# Patient Record
Sex: Female | Born: 1955 | Race: Black or African American | Hispanic: No | Marital: Single | Smoking: Never smoker
Health system: Southern US, Community
[De-identification: ages and names within clinical notes are randomized; demographics above are authoritative.]

## PROBLEM LIST (undated history)

## (undated) DIAGNOSIS — G4730 Sleep apnea, unspecified: Secondary | ICD-10-CM

## (undated) DIAGNOSIS — M7060 Trochanteric bursitis, unspecified hip: Secondary | ICD-10-CM

## (undated) DIAGNOSIS — E785 Hyperlipidemia, unspecified: Secondary | ICD-10-CM

## (undated) DIAGNOSIS — F32A Depression, unspecified: Secondary | ICD-10-CM

## (undated) DIAGNOSIS — I10 Essential (primary) hypertension: Secondary | ICD-10-CM

## (undated) DIAGNOSIS — E119 Type 2 diabetes mellitus without complications: Secondary | ICD-10-CM

## (undated) DIAGNOSIS — K219 Gastro-esophageal reflux disease without esophagitis: Secondary | ICD-10-CM

## (undated) DIAGNOSIS — G4700 Insomnia, unspecified: Secondary | ICD-10-CM

## (undated) DIAGNOSIS — G8929 Other chronic pain: Secondary | ICD-10-CM

## (undated) DIAGNOSIS — E669 Obesity, unspecified: Secondary | ICD-10-CM

## (undated) DIAGNOSIS — M549 Dorsalgia, unspecified: Secondary | ICD-10-CM

## (undated) DIAGNOSIS — F419 Anxiety disorder, unspecified: Secondary | ICD-10-CM

## (undated) DIAGNOSIS — F329 Major depressive disorder, single episode, unspecified: Secondary | ICD-10-CM

## (undated) HISTORY — PX: OTHER SURGICAL HISTORY: SHX169

## (undated) HISTORY — PX: HEMORROIDECTOMY: SUR656

## (undated) HISTORY — PX: BACK SURGERY: SHX140

---

## 1997-06-18 HISTORY — PX: LUMBAR DISC SURGERY: SHX700

## 2012-08-01 ENCOUNTER — Ambulatory Visit: Payer: Self-pay | Admitting: Family Medicine

## 2012-09-18 ENCOUNTER — Ambulatory Visit: Payer: Self-pay

## 2013-01-16 ENCOUNTER — Ambulatory Visit: Payer: Self-pay

## 2014-01-09 ENCOUNTER — Emergency Department: Payer: Self-pay | Admitting: Internal Medicine

## 2014-01-09 LAB — CBC WITH DIFFERENTIAL/PLATELET
BASOS ABS: 0.1 10*3/uL (ref 0.0–0.1)
BASOS PCT: 0.5 %
EOS ABS: 0.1 10*3/uL (ref 0.0–0.7)
Eosinophil %: 0.9 %
HCT: 33.5 % — ABNORMAL LOW (ref 35.0–47.0)
HGB: 10.7 g/dL — ABNORMAL LOW (ref 12.0–16.0)
LYMPHS ABS: 4.4 10*3/uL — AB (ref 1.0–3.6)
Lymphocyte %: 33.8 %
MCH: 29.6 pg (ref 26.0–34.0)
MCHC: 32.1 g/dL (ref 32.0–36.0)
MCV: 92 fL (ref 80–100)
Monocyte #: 1 x10 3/mm — ABNORMAL HIGH (ref 0.2–0.9)
Monocyte %: 7.4 %
NEUTROS ABS: 7.5 10*3/uL — AB (ref 1.4–6.5)
Neutrophil %: 57.4 %
Platelet: 282 10*3/uL (ref 150–440)
RBC: 3.63 10*6/uL — ABNORMAL LOW (ref 3.80–5.20)
RDW: 16.3 % — AB (ref 11.5–14.5)
WBC: 13 10*3/uL — AB (ref 3.6–11.0)

## 2014-01-09 LAB — BASIC METABOLIC PANEL
Anion Gap: 7 (ref 7–16)
BUN: 15 mg/dL (ref 7–18)
CREATININE: 0.93 mg/dL (ref 0.60–1.30)
Calcium, Total: 8.8 mg/dL (ref 8.5–10.1)
Chloride: 103 mmol/L (ref 98–107)
Co2: 30 mmol/L (ref 21–32)
EGFR (African American): >60
GLUCOSE: 158 mg/dL — AB (ref 65–99)
OSMOLALITY: 284 (ref 275–301)
Potassium: 3.6 mmol/L (ref 3.5–5.1)
Sodium: 140 mmol/L (ref 136–145)

## 2014-01-09 LAB — TROPONIN I: Troponin-I: <0.02 ng/mL

## 2014-01-09 LAB — CK: CK, Total: 790 U/L — ABNORMAL HIGH

## 2014-01-09 LAB — CK-MB: CK-MB: 2.4 ng/mL (ref 0.5–3.6)

## 2014-01-11 ENCOUNTER — Ambulatory Visit: Payer: Self-pay

## 2016-01-15 ENCOUNTER — Encounter: Payer: Self-pay | Admitting: Emergency Medicine

## 2016-01-15 ENCOUNTER — Emergency Department
Admission: EM | Admit: 2016-01-15 | Discharge: 2016-01-15 | Disposition: A | Discharge status: Home / Self Care | Payer: Medicare Other | Attending: Emergency Medicine | Admitting: Emergency Medicine

## 2016-01-15 DIAGNOSIS — R519 Headache, unspecified: Secondary | ICD-10-CM | POA: Diagnosis present

## 2016-01-15 DIAGNOSIS — G971 Other reaction to spinal and lumbar puncture: Secondary | ICD-10-CM | POA: Diagnosis not present

## 2016-01-15 DIAGNOSIS — R51 Headache: Secondary | ICD-10-CM

## 2016-01-15 DIAGNOSIS — E119 Type 2 diabetes mellitus without complications: Secondary | ICD-10-CM | POA: Diagnosis not present

## 2016-01-15 DIAGNOSIS — I10 Essential (primary) hypertension: Secondary | ICD-10-CM | POA: Insufficient documentation

## 2016-01-15 HISTORY — DX: Other chronic pain: G89.29

## 2016-01-15 HISTORY — DX: Dorsalgia, unspecified: M54.9

## 2016-01-15 HISTORY — DX: Type 2 diabetes mellitus without complications: E11.9

## 2016-01-15 HISTORY — DX: Essential (primary) hypertension: I10

## 2016-01-15 LAB — CBC WITH DIFFERENTIAL/PLATELET
BASOS ABS: 0.2 10*3/uL — AB (ref 0–0.1)
BASOS PCT: 2 %
Eosinophils Absolute: 0.1 10*3/uL (ref 0–0.7)
Eosinophils Relative: 1 %
HEMATOCRIT: 37.3 % (ref 35.0–47.0)
HEMOGLOBIN: 12.5 g/dL (ref 12.0–16.0)
Lymphocytes Relative: 32 %
Lymphs Abs: 3.5 10*3/uL (ref 1.0–3.6)
MCH: 29.6 pg (ref 26.0–34.0)
MCHC: 33.6 g/dL (ref 32.0–36.0)
MCV: 88.2 fL (ref 80.0–100.0)
Monocytes Absolute: 0.8 10*3/uL (ref 0.2–0.9)
Monocytes Relative: 7 %
NEUTROS ABS: 6.6 10*3/uL — AB (ref 1.4–6.5)
NEUTROS PCT: 58 %
Platelets: 321 10*3/uL (ref 150–440)
RBC: 4.23 MIL/uL (ref 3.80–5.20)
RDW: 15.7 % — ABNORMAL HIGH (ref 11.5–14.5)
WBC: 11.2 10*3/uL — ABNORMAL HIGH (ref 3.6–11.0)

## 2016-01-15 LAB — COMPREHENSIVE METABOLIC PANEL
ALK PHOS: 93 U/L (ref 38–126)
ALT: 22 U/L (ref 14–54)
ANION GAP: 9 (ref 5–15)
AST: 19 U/L (ref 15–41)
Albumin: 4.1 g/dL (ref 3.5–5.0)
BILIRUBIN TOTAL: 0.6 mg/dL (ref 0.3–1.2)
BUN: 19 mg/dL (ref 6–20)
CALCIUM: 9.5 mg/dL (ref 8.9–10.3)
CO2: 28 mmol/L (ref 22–32)
Chloride: 100 mmol/L — ABNORMAL LOW (ref 101–111)
Creatinine, Ser: 0.66 mg/dL (ref 0.44–1.00)
GFR calc non Af Amer: >60 mL/min (ref >60)
Glucose, Bld: 171 mg/dL — ABNORMAL HIGH (ref 65–99)
Potassium: 4 mmol/L (ref 3.5–5.1)
SODIUM: 137 mmol/L (ref 135–145)
TOTAL PROTEIN: 7.4 g/dL (ref 6.5–8.1)

## 2016-01-15 MED ORDER — SODIUM CHLORIDE 0.9 % IV BOLUS (SEPSIS)
1000.0000 mL | Freq: Once | INTRAVENOUS | Status: AC
  Administered 2016-01-15: 1000 mL via INTRAVENOUS

## 2016-01-15 MED ORDER — HYDROMORPHONE HCL 1 MG/ML IJ SOLN
1.0000 mg | Freq: Once | INTRAMUSCULAR | Status: AC
  Administered 2016-01-15: 1 mg via INTRAVENOUS
  Filled 2016-01-15: qty 1

## 2016-01-15 MED ORDER — ONDANSETRON HCL 4 MG/2ML IJ SOLN
4.0000 mg | Freq: Once | INTRAMUSCULAR | Status: AC
  Administered 2016-01-15: 4 mg via INTRAVENOUS
  Filled 2016-01-15: qty 2

## 2016-01-15 MED ORDER — LIDOCAINE 5 % EX PTCH
1.0000 | MEDICATED_PATCH | CUTANEOUS | Status: DC
  Administered 2016-01-15: 1 via TRANSDERMAL
  Filled 2016-01-15: qty 1

## 2016-01-15 MED ORDER — PROMETHAZINE HCL 12.5 MG RE SUPP: 12.5000 mg | Freq: Four times a day (QID) | RECTAL | 0 refills | Status: DC | PRN

## 2016-01-15 MED ORDER — CAFFEINE CITRATE NICU IV 10 MG/ML (BASE)
20.0000 mg/kg | Freq: Once | INTRAVENOUS | Status: DC
Start: 2016-01-15 — End: 2016-01-15

## 2016-01-15 MED ORDER — PROMETHAZINE HCL 25 MG/ML IJ SOLN
6.2500 mg | Freq: Once | INTRAMUSCULAR | Status: AC
  Administered 2016-01-15: 6.25 mg via INTRAVENOUS
  Filled 2016-01-15: qty 1

## 2016-01-15 MED ORDER — PROMETHAZINE HCL 12.5 MG PO TABS: 12.5000 mg | ORAL_TABLET | Freq: Four times a day (QID) | ORAL | 0 refills | Status: DC | PRN

## 2016-01-15 MED ORDER — HYDROMORPHONE HCL 2 MG PO TABS: 2.0000 mg | ORAL_TABLET | Freq: Two times a day (BID) | ORAL | 0 refills | Status: DC | PRN

## 2016-01-15 MED ORDER — SODIUM CHLORIDE 0.9 % IV SOLN
500.0000 mg | Freq: Once | INTRAVENOUS | Status: AC
  Administered 2016-01-15: 500 mg via INTRAVENOUS
  Filled 2016-01-15: qty 2

## 2016-01-15 MED ORDER — HYDROMORPHONE HCL 2 MG PO TABS
2.0000 mg | ORAL_TABLET | Freq: Once | ORAL | Status: AC
  Administered 2016-01-15: 2 mg via ORAL
  Filled 2016-01-15: qty 1

## 2016-01-15 MED ORDER — LORAZEPAM 2 MG/ML IJ SOLN
1.0000 mg | Freq: Once | INTRAMUSCULAR | Status: AC
  Administered 2016-01-15: 1 mg via INTRAVENOUS
  Filled 2016-01-15: qty 1

## 2016-01-15 MED ORDER — DIPHENHYDRAMINE HCL 50 MG/ML IJ SOLN
50.0000 mg | Freq: Once | INTRAMUSCULAR | Status: AC
  Administered 2016-01-15: 50 mg via INTRAVENOUS
  Filled 2016-01-15: qty 1

## 2016-01-15 NOTE — ED Notes (Signed)
Patient placed on 2L O2 via [place] for sats at 88-89% while resting.

## 2016-01-15 NOTE — ED Notes (Addendum)
Pt states the lidoderm patch is causing me to itch. States it has been a long time since I have had one but it is causing me to itch. Nurse informs pt she will add it to list of allergies. No hives noted at this time. No distress noted. Huel Cote, MD informed

## 2016-01-15 NOTE — Discharge Instructions (Signed)
Please return immediately if condition worsens. Please contact her primary physician or the physician you were given for referral. If you have any specialist physicians involved in her treatment and plan please also contact them. Thank you for using [facility]. ° °

## 2016-01-15 NOTE — ED Notes (Signed)
Two unsuccessful IV attempts made. Will defer to another RN.

## 2016-01-15 NOTE — ED Triage Notes (Addendum)
BIB EMS for c/o headache and nausea for one week. Pt diagnosed with RMSF on the 27th. Started on Doxycycline. Pt also had a Spinal Tap at [facility] on the 27th

## 2016-01-15 NOTE — ED Provider Notes (Signed)
Time Seen: Approximately 1606  I have reviewed the triage notes  Chief Complaint: Migraine and Nausea   History of Present Illness: Courtney [name] is a 60 y.o. female who apparently sat a one-week history of headache and had a tick bite on the posterior surface of her right knee. The patient states that she went to [facility] and apparently had a very extensive workup 2 days ago which include a head CT, spinal tap, laboratory work. Patient was placed on doxycycline. She states that she has not had a rash though she did have a fever. She denies any productive cough, wheezing, dysuria or hematuria. She states she hasn't had a fever recently but started to develop a very positional throbbing headache with some persistent nausea and vomiting. He has tried taking her medications at home without any significant relief. She states that she notified EMS and was trying to be transported to [facility] system, but was [place]   Past Medical History:  Diagnosis Date  . Chronic back pain   . Diabetes mellitus without complication (HCC)   . Hypertension     There are no active problems to display for this patient.   Past Surgical History:  Procedure Laterality Date  . BACK SURGERY    . right shoulder surgery       Past Surgical History:  Procedure Laterality Date  . BACK SURGERY    . right shoulder surgery         Allergies:  Review of patient's allergies indicates not on file.  Family History: No family history on file.  Social History: Social History  Substance Use Topics  . Smoking status: Never Smoker  . Smokeless tobacco: Never Used  . Alcohol use No     Review of Systems:   10 point review of systems was performed and was otherwise negative:  Constitutional: No Current fever Eyes: No visual disturbances ENT: No sore throat, ear pain Cardiac: No chest pain Respiratory: No shortness of breath, wheezing, or  stridor Abdomen: No abdominal pain, no vomiting, No diarrhea Endocrine: No weight loss, No night sweats Extremities: No peripheral edema, cyanosis Skin: No rashes, easy bruising Neurologic: No focal weakness, trouble with speech or swollowing Urologic: No dysuria, Hematuria, or urinary frequency Headache is global in nature and somewhat positional  Physical Exam:  ED Triage Vitals  Enc Vitals Group     BP 01/15/16 1601 126/65     Pulse Rate 01/15/16 1601 62     Resp 01/15/16 1601 18     Temp --      Temp Source 01/15/16 1601 Oral     SpO2 01/15/16 1601 94 %     Weight 01/15/16 1601 178 lb (80.7 kg)     Height 01/15/16 1601 5' 0.5" (1.537 m)     Head Circumference --      Peak Flow --      Pain Score 01/15/16 1556 10     Pain Loc --      Pain Edu? --      Excl. in GC? --     General: Awake , Alert , and Oriented times 3; GCS 15. His uncomfortable, anxious Head: Normal cephalic , atraumatic Eyes: Pupils equal , round, reactive to light Nose/Throat: No nasal drainage, patent upper airway without erythema or exudate.  Neck: Supple, Full range of motion, No anterior adenopathy or palpable thyroid masses Lungs: Clear to ascultation without wheezes , rhonchi, or  rales Heart: Regular rate, regular rhythm without murmurs , gallops , or rubs Abdomen: Soft, non tender without rebound, guarding , or rigidity; bowel sounds positive and symmetric in all 4 quadrants. No organomegaly .        Extremities: 2 plus symmetric pulses. No edema, clubbing or cyanosis Neurologic: normal ambulation, Motor symmetric without deficits, sensory intact Skin: warm, dry, no rashes examination and posterior area of her right leg does not show any evidence of erythema exudate or drainage. Labs:   All laboratory work was reviewed including any pertinent negatives or positives listed below:  Labs Reviewed  COMPREHENSIVE METABOLIC PANEL  CBC WITH DIFFERENTIAL/PLATELET  Laboratory work was reviewed and  showed no clinically significant abnormalities.    ED Course:  Patient's stay here was uneventful and had some gradual symptomatic improvement. She was given IV fluid bolus, IV caffeine, series of doses of IV Dilaudid and Zofran. Her symptoms and presentation point to a post spinal tap headache with a very positional throbbing global nature to her discomfort. I felt this was unlikely to be acute life-threatening cosmetically with her most recent negative evaluation at another facility with a negative head CT and spinal tap, etc. Her only receiving doxycycline for [facility]. spotted fever and she was advised continue the medication. She most likely needs a epidural blood patch and had discussed it briefly with the on-call anesthesiologist and the patient was referred to the pain clinic. She's also been advised if she is unsuccessful establish an appointment with our local pain clinic that she can always return to the [facility] system and they would most likely be able to do a blood patch if needed. She was advised to return here if she develops focal weakness, fever, or any other new concerns.  Clinical Course     Assessment: * Post lumbar puncture headache     Plan:  Outpatient Prescriptions for Dilaudid, Phenergan tablets, and Phenergan suppositories Patient was advised to return immediately if condition worsens. Patient was advised to follow up with their primary care physician or other specialized physicians involved in their outpatient care. The patient and/or family member/power of attorney had laboratory results reviewed at the bedside. All questions and concerns were addressed and appropriate discharge instructions were distributed by the nursing staff.            Jennye Moccasin, MD 01/15/16 [id number]

## 2016-04-05 ENCOUNTER — Encounter: Payer: Self-pay | Admitting: Emergency Medicine

## 2016-04-05 ENCOUNTER — Emergency Department: Payer: Medicare Other

## 2016-04-05 ENCOUNTER — Ambulatory Visit (INDEPENDENT_AMBULATORY_CARE_PROVIDER_SITE_OTHER)
Admission: EM | Admit: 2016-04-05 | Discharge: 2016-04-05 | Disposition: A | Discharge status: Home / Self Care | Payer: Medicare Other | Source: Home / Self Care | Attending: Family Medicine | Admitting: Family Medicine

## 2016-04-05 ENCOUNTER — Emergency Department
Admission: EM | Admit: 2016-04-05 | Discharge: 2016-04-05 | Disposition: A | Discharge status: Home / Self Care | Payer: Medicare Other | Attending: Emergency Medicine | Admitting: Emergency Medicine

## 2016-04-05 DIAGNOSIS — Y929 Unspecified place or not applicable: Secondary | ICD-10-CM | POA: Insufficient documentation

## 2016-04-05 DIAGNOSIS — E785 Hyperlipidemia, unspecified: Secondary | ICD-10-CM | POA: Insufficient documentation

## 2016-04-05 DIAGNOSIS — E119 Type 2 diabetes mellitus without complications: Secondary | ICD-10-CM

## 2016-04-05 DIAGNOSIS — S299XXA Unspecified injury of thorax, initial encounter: Secondary | ICD-10-CM | POA: Diagnosis present

## 2016-04-05 DIAGNOSIS — I10 Essential (primary) hypertension: Secondary | ICD-10-CM

## 2016-04-05 DIAGNOSIS — R079 Chest pain, unspecified: Secondary | ICD-10-CM | POA: Insufficient documentation

## 2016-04-05 DIAGNOSIS — R071 Chest pain on breathing: Secondary | ICD-10-CM | POA: Diagnosis not present

## 2016-04-05 DIAGNOSIS — Y999 Unspecified external cause status: Secondary | ICD-10-CM | POA: Insufficient documentation

## 2016-04-05 DIAGNOSIS — Z79899 Other long term (current) drug therapy: Secondary | ICD-10-CM | POA: Insufficient documentation

## 2016-04-05 DIAGNOSIS — S2232XA Fracture of one rib, left side, initial encounter for closed fracture: Secondary | ICD-10-CM | POA: Insufficient documentation

## 2016-04-05 DIAGNOSIS — R0789 Other chest pain: Secondary | ICD-10-CM | POA: Diagnosis not present

## 2016-04-05 DIAGNOSIS — W19XXXA Unspecified fall, initial encounter: Secondary | ICD-10-CM | POA: Insufficient documentation

## 2016-04-05 DIAGNOSIS — Y939 Activity, unspecified: Secondary | ICD-10-CM | POA: Insufficient documentation

## 2016-04-05 LAB — COMPREHENSIVE METABOLIC PANEL
ALBUMIN: 4 g/dL (ref 3.5–5.0)
ALK PHOS: 104 U/L (ref 38–126)
ALT: 34 U/L (ref 14–54)
ANION GAP: 10 (ref 5–15)
AST: 43 U/L — ABNORMAL HIGH (ref 15–41)
BILIRUBIN TOTAL: 0.9 mg/dL (ref 0.3–1.2)
BUN: 15 mg/dL (ref 6–20)
CALCIUM: 9.1 mg/dL (ref 8.9–10.3)
CO2: 26 mmol/L (ref 22–32)
Chloride: 100 mmol/L — ABNORMAL LOW (ref 101–111)
Creatinine, Ser: 0.78 mg/dL (ref 0.44–1.00)
Glucose, Bld: 253 mg/dL — ABNORMAL HIGH (ref 65–99)
POTASSIUM: 4 mmol/L (ref 3.5–5.1)
Sodium: 136 mmol/L (ref 135–145)
TOTAL PROTEIN: 7.3 g/dL (ref 6.5–8.1)

## 2016-04-05 LAB — CBC
HEMATOCRIT: 35.7 % (ref 35.0–47.0)
Hemoglobin: 12 g/dL (ref 12.0–16.0)
MCH: 29.7 pg (ref 26.0–34.0)
MCHC: 33.8 g/dL (ref 32.0–36.0)
MCV: 87.9 fL (ref 80.0–100.0)
PLATELETS: 374 10*3/uL (ref 150–440)
RBC: 4.06 MIL/uL (ref 3.80–5.20)
RDW: 16 % — AB (ref 11.5–14.5)
WBC: 10.9 10*3/uL (ref 3.6–11.0)

## 2016-04-05 LAB — TROPONIN I: Troponin I: <0.03 ng/mL (ref <0.03)

## 2016-04-05 LAB — FIBRIN DERIVATIVES D-DIMER (ARMC ONLY): FIBRIN DERIVATIVES D-DIMER (ARMC): 365 (ref 0–499)

## 2016-04-05 MED ORDER — HYDROMORPHONE HCL 1 MG/ML IJ SOLN
1.0000 mg | Freq: Once | INTRAMUSCULAR | Status: AC
  Administered 2016-04-05: 1 mg via INTRAVENOUS
  Filled 2016-04-05: qty 1

## 2016-04-05 MED ORDER — MORPHINE SULFATE (PF) 2 MG/ML IV SOLN
4.0000 mg | Freq: Once | INTRAVENOUS | Status: AC
  Administered 2016-04-05: 4 mg via INTRAVENOUS
  Filled 2016-04-05: qty 2

## 2016-04-05 MED ORDER — LORAZEPAM 2 MG/ML IJ SOLN
1.0000 mg | Freq: Once | INTRAMUSCULAR | Status: AC
  Administered 2016-04-05: 1 mg via INTRAVENOUS
  Filled 2016-04-05: qty 1

## 2016-04-05 MED ORDER — LORAZEPAM 2 MG/ML IJ SOLN
0.5000 mg | Freq: Once | INTRAMUSCULAR | Status: AC
  Administered 2016-04-05: 0.5 mg via INTRAVENOUS
  Filled 2016-04-05: qty 1

## 2016-04-05 MED ORDER — ASPIRIN 81 MG PO CHEW
324.0000 mg | CHEWABLE_TABLET | Freq: Once | ORAL | Status: AC
  Administered 2016-04-05: 324 mg via ORAL

## 2016-04-05 MED ORDER — IOPAMIDOL (ISOVUE-370) INJECTION 76%
75.0000 mL | Freq: Once | INTRAVENOUS | Status: AC | PRN
  Administered 2016-04-05: 75 mL via INTRAVENOUS

## 2016-04-05 MED ORDER — OXYCODONE-ACETAMINOPHEN 7.5-325 MG PO TABS: 1.0000 | ORAL_TABLET | ORAL | 0 refills | Status: AC | PRN
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
## 2016-04-05 NOTE — ED Notes (Addendum)
Pt bib EMS from urgent care w/ c/o CP.  Pt sts that CP has been ongoing x 1 week.  Pt c/o incr SOB today. Pt sts that pain is in L chest and radiates to back.  Pt A/Ox4, able to answer all questions w/o issues.  Pt appears upset in triage.  Per EMS pt received 81 mg ASA and 1 SL NTG.  EMS sts pt received some relief w/ NTG

## 2016-04-05 NOTE — ED Notes (Signed)
EMS called to transport patient to [facility] ED 
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
## 2016-04-05 NOTE — ED Notes (Signed)
Dr. Mayford KnifeWilliams aware of pain control issues with pt. No new orders at this time.
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
## 2016-04-05 NOTE — ED Notes (Signed)
Discharge instructions reviewed with patient. Questions fielded by this RN. Patient verbalizes understanding of instructions. Patient discharged home in stable condition per Williams MD . No acute distress noted at time of discharge.   

## 2016-04-08 ENCOUNTER — Telehealth: Payer: Self-pay

## 2016-04-08 NOTE — Telephone Encounter (Signed)
Courtesy call back completed today for patients recent visit at [facility]. Patient did not answer, left message on voicemail to call back with any questions or concerns.   

## 2016-05-01 ENCOUNTER — Emergency Department: Payer: Medicare Other

## 2016-05-01 ENCOUNTER — Emergency Department
Admission: EM | Admit: 2016-05-01 | Discharge: 2016-05-01 | Disposition: A | Discharge status: Home / Self Care | Payer: Medicare Other | Attending: Emergency Medicine | Admitting: Emergency Medicine

## 2016-05-01 DIAGNOSIS — X58XXXD Exposure to other specified factors, subsequent encounter: Secondary | ICD-10-CM | POA: Insufficient documentation

## 2016-05-01 DIAGNOSIS — E119 Type 2 diabetes mellitus without complications: Secondary | ICD-10-CM | POA: Diagnosis not present

## 2016-05-01 DIAGNOSIS — J0100 Acute maxillary sinusitis, unspecified: Secondary | ICD-10-CM

## 2016-05-01 DIAGNOSIS — S2242XD Multiple fractures of ribs, left side, subsequent encounter for fracture with routine healing: Secondary | ICD-10-CM | POA: Diagnosis not present

## 2016-05-01 DIAGNOSIS — I10 Essential (primary) hypertension: Secondary | ICD-10-CM | POA: Diagnosis not present

## 2016-05-01 DIAGNOSIS — R51 Headache: Secondary | ICD-10-CM | POA: Diagnosis present

## 2016-05-01 LAB — URINALYSIS COMPLETE WITH MICROSCOPIC (ARMC ONLY)
Bilirubin Urine: NEGATIVE
Glucose, UA: 50 mg/dL — AB
HGB URINE DIPSTICK: NEGATIVE
KETONES UR: NEGATIVE mg/dL
Leukocytes, UA: NEGATIVE
NITRITE: NEGATIVE
PROTEIN: NEGATIVE mg/dL
SPECIFIC GRAVITY, URINE: 1.018 (ref 1.005–1.030)
pH: 7 (ref 5.0–8.0)

## 2016-05-01 LAB — COMPREHENSIVE METABOLIC PANEL
ALK PHOS: 81 U/L (ref 38–126)
ALT: 24 U/L (ref 14–54)
ANION GAP: 9 (ref 5–15)
AST: 36 U/L (ref 15–41)
Albumin: 4 g/dL (ref 3.5–5.0)
BUN: 14 mg/dL (ref 6–20)
CALCIUM: 8.7 mg/dL — AB (ref 8.9–10.3)
CHLORIDE: 102 mmol/L (ref 101–111)
CO2: 27 mmol/L (ref 22–32)
CREATININE: 0.85 mg/dL (ref 0.44–1.00)
Glucose, Bld: 147 mg/dL — ABNORMAL HIGH (ref 65–99)
Potassium: 4.4 mmol/L (ref 3.5–5.1)
SODIUM: 138 mmol/L (ref 135–145)
Total Bilirubin: 0.5 mg/dL (ref 0.3–1.2)
Total Protein: 7.2 g/dL (ref 6.5–8.1)

## 2016-05-01 LAB — CBC WITH DIFFERENTIAL/PLATELET
Basophils Absolute: 0.1 10*3/uL (ref 0–0.1)
Basophils Relative: 0 %
EOS PCT: 1 %
Eosinophils Absolute: 0.2 10*3/uL (ref 0–0.7)
HEMATOCRIT: 39.2 % (ref 35.0–47.0)
Hemoglobin: 12.8 g/dL (ref 12.0–16.0)
LYMPHS ABS: 2.9 10*3/uL (ref 1.0–3.6)
LYMPHS PCT: 20 %
MCH: 29 pg (ref 26.0–34.0)
MCHC: 32.8 g/dL (ref 32.0–36.0)
MCV: 88.5 fL (ref 80.0–100.0)
MONO ABS: 1.3 10*3/uL — AB (ref 0.2–0.9)
Monocytes Relative: 9 %
Neutro Abs: 10.2 10*3/uL — ABNORMAL HIGH (ref 1.4–6.5)
Neutrophils Relative %: 70 %
PLATELETS: 346 10*3/uL (ref 150–440)
RBC: 4.43 MIL/uL (ref 3.80–5.20)
RDW: 16.5 % — AB (ref 11.5–14.5)
WBC: 14.6 10*3/uL — ABNORMAL HIGH (ref 3.6–11.0)

## 2016-05-01 MED ORDER — FENTANYL CITRATE (PF) 100 MCG/2ML IJ SOLN
25.0000 ug | Freq: Once | INTRAMUSCULAR | Status: AC
  Administered 2016-05-01: 25 ug via INTRAVENOUS
  Filled 2016-05-01: qty 2

## 2016-05-01 MED ORDER — METOCLOPRAMIDE HCL 10 MG PO TABS: 10.0000 mg | ORAL_TABLET | Freq: Four times a day (QID) | ORAL | 0 refills | Status: DC | PRN

## 2016-05-01 MED ORDER — ACETAMINOPHEN 500 MG PO TABS
1000.0000 mg | ORAL_TABLET | Freq: Once | ORAL | Status: AC
  Administered 2016-05-01: 1000 mg via ORAL
  Filled 2016-05-01: qty 2

## 2016-05-01 MED ORDER — MORPHINE SULFATE (PF) 4 MG/ML IV SOLN
4.0000 mg | Freq: Once | INTRAVENOUS | Status: AC
  Administered 2016-05-01: 4 mg via INTRAVENOUS
  Filled 2016-05-01: qty 1

## 2016-05-01 MED ORDER — DOXYCYCLINE HYCLATE 100 MG PO CAPS: 100.0000 mg | ORAL_CAPSULE | Freq: Two times a day (BID) | ORAL | 0 refills | Status: DC

## 2016-05-01 MED ORDER — HYDROCODONE-ACETAMINOPHEN 5-325 MG PO TABS: 1.0000 | ORAL_TABLET | Freq: Four times a day (QID) | ORAL | 0 refills | Status: DC | PRN

## 2016-05-01 MED ORDER — SODIUM CHLORIDE 0.9 % IV BOLUS (SEPSIS)
1000.0000 mL | Freq: Once | INTRAVENOUS | Status: AC
  Administered 2016-05-01: 1000 mL via INTRAVENOUS

## 2016-05-01 MED ORDER — DOXYCYCLINE HYCLATE 100 MG PO TABS
100.0000 mg | ORAL_TABLET | Freq: Once | ORAL | Status: AC
  Administered 2016-05-01: 100 mg via ORAL
  Filled 2016-05-01: qty 1

## 2016-05-01 MED ORDER — PROMETHAZINE HCL 25 MG/ML IJ SOLN
25.0000 mg | Freq: Once | INTRAMUSCULAR | Status: AC
  Administered 2016-05-01: 25 mg via INTRAVENOUS
  Filled 2016-05-01: qty 1

## 2016-05-01 NOTE — ED Triage Notes (Signed)
Pt c/o sinus congestion with HA and nausea and vomiting for the past 5 days, states she has been taking OTC sinus meds without relief..Marland Kitchen

## 2016-05-01 NOTE — ED Provider Notes (Signed)
[facility]-EMERGENCY DEPARTMENT Provider Note   CSN: [id number] Arrival date & time: 05/01/16  1457     History   Chief Complaint Chief Complaint  Patient presents with  . Emesis  . Headache  . Facial Pain    HPI Courtney [name] is a 60 y.o. female hx of DM, HTN, back pain, Here presenting with headaches, sinus congestion, cough, chills. States that she has been having constant headaches for the last 5 days. Been trying to take some sinus medicine as well as Tylenol with no relief. Has some vomiting associated with it as well. She does have some subjective chills as well as productive cough. Recently seen in the ED and had some rib fractures with no complicating features.   The history is provided by the patient.    Past Medical History:  Diagnosis Date  . Chronic back pain   . Diabetes mellitus without complication (HCC)   . Hypertension     Patient Active Problem List   Diagnosis Date Noted  . Bad headache 01/15/2016    Past Surgical History:  Procedure Laterality Date  . BACK SURGERY    . HEMORROIDECTOMY    . right shoulder surgery       OB History    No data available       Home Medications    Prior to Admission medications   Medication Sig Start Date End Date Taking? Authorizing Provider  HYDROmorphone (DILAUDID) 2 MG tablet Take 1 tablet (2 mg total) by mouth every 12 (twelve) hours as needed for severe pain. 01/15/16   Jennye MoccasinBrian S Quigley, MD  oxyCODONE-acetaminophen (PERCOCET) 7.5-325 MG tablet Take 1 tablet by mouth every 4 (four) hours as needed for severe pain. 04/05/16 04/05/17  Emily FilbertJonathan E Williams, MD  promethazine (PHENERGAN) 12.5 MG suppository Place 1 suppository (12.5 mg total) rectally every 6 (six) hours as needed for nausea or vomiting. 01/15/16   Jennye MoccasinBrian S Quigley, MD  promethazine (PHENERGAN) 12.5 MG tablet Take 1 tablet (12.5 mg total) by mouth every 6 (six) hours as needed for nausea or vomiting. 01/15/16   Jennye MoccasinBrian S Quigley, MD    Family History No  family history on file.  Social History Social History  Substance Use Topics  . Smoking status: Never Smoker  . Smokeless tobacco: Never Used  . Alcohol use No     Allergies   Ace inhibitors; Codeine; Lidoderm [lidocaine]; Naproxen; Niacin and related; Penicillins; Pravastatin; Prednisone; Simvastatin; and Tramadol   Review of Systems Review of Systems  Gastrointestinal: Positive for vomiting.  Neurological: Positive for headaches.  All other systems reviewed and are negative.    Physical Exam Updated Vital Signs BP (!) 145/86   Pulse 88   Temp 98.4 F (36.9 C) (Oral)   Resp 18   Ht 5\' 1"  (1.549 m)   Wt 173 lb (78.5 kg)   SpO2 99%   BMI 32.69 kg/m   Physical Exam  Constitutional: She is oriented to person, place, and time.  Uncomfortable   HENT:  Head: Normocephalic.  + maxillary and frontal sinus tenderness. No purulent discharge from the nose   Eyes: EOM are normal. Pupils are equal, round, and reactive to light.  Neck: Normal range of motion. Neck supple.  No meningeal signs   Cardiovascular: Normal rate, regular rhythm and normal heart sounds.   Pulmonary/Chest: Effort normal and breath sounds normal. She has no rales.  Diminished bilaterally   Abdominal: Soft. Bowel sounds are normal. She exhibits no distension. There is no  tenderness. There is no guarding.  Musculoskeletal: Normal range of motion.  Neurological: She is alert and oriented to person, place, and time. No cranial nerve deficit. Coordination normal.  Skin: Skin is warm.  Psychiatric: She has a normal mood and affect.  Nursing note and vitals reviewed.    ED Treatments / Results  Labs (all labs ordered are listed, but only abnormal results are displayed) Labs Reviewed  CBC WITH DIFFERENTIAL/PLATELET - Abnormal; Notable for the following:       Result Value   WBC 14.6 (*)    RDW 16.5 (*)    Neutro Abs 10.2 (*)    Monocytes Absolute 1.3 (*)    All other components within normal limits    URINALYSIS COMPLETEWITH MICROSCOPIC ([facility] ONLY) - Abnormal; Notable for the following:    Color, Urine STRAW (*)    APPearance CLEAR (*)    Glucose, UA 50 (*)    Bacteria, UA RARE (*)    Squamous Epithelial / LPF 0-5 (*)    All other components within normal limits  COMPREHENSIVE METABOLIC PANEL - Abnormal; Notable for the following:    Glucose, Bld 147 (*)    Calcium 8.7 (*)    All other components within normal limits    EKG  EKG Interpretation None       Radiology Dg Chest 2 View  Result Date: 05/01/2016 CLINICAL DATA:  Productive cough with headache and body aches first 5 days. Diabetes. EXAM: CHEST  2 VIEW COMPARISON:  04/05/2016 chest CT FINDINGS: Mildly tortuous thoracic aorta. Heart size within normal limits. The lungs appear clear. Rib sclerosis/ irregularities of the left fifth, sixth, seventh, eighth, and ninth posterolateral ribs compatible with healing rib fractures. No pneumothorax or pleural effusion. IMPRESSION: 1. There are healing fractures the left fifth, sixth, seventh, eighth, and ninth ribs posterolaterally, without pneumothorax or pleural effusion. 2. Tortuous thoracic aorta. Electronically Signed   By: Gaylyn Rong M.D.   On: 05/01/2016 17:27    Procedures Procedures (including critical care time)  Medications Ordered in ED Medications  promethazine (PHENERGAN) injection 25 mg (25 mg Intravenous Given 05/01/16 1647)  acetaminophen (TYLENOL) tablet 1,000 mg (1,000 mg Oral Given 05/01/16 1647)  sodium chloride 0.9 % bolus 1,000 mL (0 mLs Intravenous Stopped 05/01/16 1812)  fentaNYL (SUBLIMAZE) injection 25 mcg (25 mcg Intravenous Given 05/01/16 1647)  morphine 4 MG/ML injection 4 mg (4 mg Intravenous Given 05/01/16 1811)  doxycycline (VIBRA-TABS) tablet 100 mg (100 mg Oral Given 05/01/16 1810)     Initial Impression / Assessment and Plan / ED Course  I have reviewed the triage vital signs and the nursing notes.  Pertinent labs & imaging  results that were available during my care of the patient were reviewed by me and considered in my medical decision making (see chart for details).  Clinical Course     Courtney [name] is a 60 y.o. female here with headaches, myalgias, cough. Likely viral syndrome vs sinusitis vs pneumonia. Will hydrate patient and give migraine cocktail. Will get CXR, labs, UA. No meningeal signs, afebrile, normal vital signs    7:31 PM Labs showed WBC 14. CXR showed healing fractures. Likely has bacterial sinusitis. She is using flonase. Will dc home with doxycyline (has allergic to PCN), will give pain meds for healing rib fractures.   Final Clinical Impressions(s) / ED Diagnoses   Final diagnoses:  None    New Prescriptions New Prescriptions   No medications on file     [facility]  Silverio LayYao, MD 05/01/16 [phone]

## 2016-05-01 NOTE — ED Notes (Addendum)
Pt c/o severe headache with nausea and vomiting x5days. Pt denies fevers at home. Pt denies any blurred vision or sensitivity to light. A&Ox4, speech clear

## 2016-05-01 NOTE — Discharge Instructions (Signed)
Take vicodin for severe pain. Do NOT drive with it.   Take doxycycline as prescribed.   Continue flonase twice daily .  Take reglan for headaches and vomiting.   See your doctor  Return to ER if you have worse sinus congestion, headaches, vomiting, fevers, worse rib pain.

## 2016-05-01 NOTE — ED Notes (Signed)
Patient transported to radiology

## 2016-08-06 ENCOUNTER — Emergency Department: Payer: Medicare Other

## 2016-08-06 ENCOUNTER — Encounter: Payer: Self-pay | Admitting: Emergency Medicine

## 2016-08-06 ENCOUNTER — Emergency Department
Admission: EM | Admit: 2016-08-06 | Discharge: 2016-08-06 | Disposition: A | Discharge status: Home / Self Care | Payer: Medicare Other | Attending: Emergency Medicine | Admitting: Emergency Medicine

## 2016-08-06 DIAGNOSIS — I10 Essential (primary) hypertension: Secondary | ICD-10-CM | POA: Insufficient documentation

## 2016-08-06 DIAGNOSIS — E119 Type 2 diabetes mellitus without complications: Secondary | ICD-10-CM | POA: Diagnosis not present

## 2016-08-06 DIAGNOSIS — M25551 Pain in right hip: Secondary | ICD-10-CM | POA: Diagnosis not present

## 2016-08-06 MED ORDER — HYDROCODONE-ACETAMINOPHEN 5-325 MG PO TABS: 1.0000 | ORAL_TABLET | ORAL | 0 refills | Status: DC | PRN

## 2016-08-06 MED ORDER — OXYCODONE-ACETAMINOPHEN 5-325 MG PO TABS
2.0000 | ORAL_TABLET | Freq: Once | ORAL | Status: AC
  Administered 2016-08-06: 2 via ORAL
  Filled 2016-08-06: qty 2

## 2016-08-06 NOTE — ED Notes (Signed)
Xray notified that pt was in a room and can now be transported to xray. NAD.

## 2016-08-06 NOTE — ED Triage Notes (Addendum)
Says was walking out of [facility] yesterday and felt a pop in right hip.  Has had a headache also.  [facility]  Says she has had the headache since and that they are aware of that,. Says she cannot walk today.

## 2016-08-06 NOTE — ED Notes (Signed)
Pt able to stand up from wheelchair to transfer stretcher.

## 2016-08-06 NOTE — ED Notes (Signed)
Pt returns from xray

## 2016-08-06 NOTE — ED Provider Notes (Signed)
[facility] Emergency Department Provider Note  Time seen: 4:13 PM  I have reviewed the triage vital signs and the nursing notes.   HISTORY  Chief Complaint Hip Pain    HPI Courtney [name] is a 61 y.o. female with a past medical history of back pain, diabetes, hypertension who presents with right hip pain. According to the patient she was walking yesterday when she felt a pop in her right hip, states the pain became unbearable and she has been unable to walk on the right hip since then. She also states a tingling sensation down her right leg. Denies any weakness or numbness of the leg. Patient does have a history of chronic lower back pain. She also states she has developed a moderate headache since the symptoms began yesterday. Describes her pain as moderate, severe with movement of the right hip.  Past Medical History:  Diagnosis Date  . Chronic back pain   . Diabetes mellitus without complication (HCC)   . Hypertension     Patient Active Problem List   Diagnosis Date Noted  . Bad headache 01/15/2016    Past Surgical History:  Procedure Laterality Date  . BACK SURGERY    . HEMORROIDECTOMY    . right shoulder surgery       Prior to Admission medications   Medication Sig Start Date End Date Taking? Authorizing Provider  doxycycline (VIBRAMYCIN) 100 MG capsule Take 1 capsule (100 mg total) by mouth 2 (two) times daily. One po bid x 7 days 05/01/16   Charlynne Pander, MD  HYDROcodone-acetaminophen (NORCO/VICODIN) 5-325 MG tablet Take 1 tablet by mouth every 6 (six) hours as needed for moderate pain. 05/01/16   Charlynne Pander, MD  HYDROmorphone (DILAUDID) 2 MG tablet Take 1 tablet (2 mg total) by mouth every 12 (twelve) hours as needed for severe pain. 01/15/16   Jennye Moccasin, MD  metoCLOPramide (REGLAN) 10 MG tablet Take 1 tablet (10 mg total) by mouth every 6 (six) hours as needed for nausea (nausea/headache). 05/01/16   Charlynne Pander, MD   oxyCODONE-acetaminophen (PERCOCET) 7.5-325 MG tablet Take 1 tablet by mouth every 4 (four) hours as needed for severe pain. 04/05/16 04/05/17  Emily Filbert, MD  promethazine (PHENERGAN) 12.5 MG suppository Place 1 suppository (12.5 mg total) rectally every 6 (six) hours as needed for nausea or vomiting. 01/15/16   Jennye Moccasin, MD  promethazine (PHENERGAN) 12.5 MG tablet Take 1 tablet (12.5 mg total) by mouth every 6 (six) hours as needed for nausea or vomiting. 01/15/16   Jennye Moccasin, MD    Allergies  Allergen Reactions  . Ace Inhibitors Cough  . Codeine Itching    With Tylenol   . Lidoderm [Lidocaine] Itching    Patch   . Naproxen Itching  . Niacin And Related     nausea   . Penicillins Itching  . Pravastatin Other (See Comments)    Fatigue, headache   . Prednisone Itching  . Simvastatin Itching  . Tramadol Other (See Comments)    Chest pain    No family history on file.  Social History Social History  Substance Use Topics  . Smoking status: Never Smoker  . Smokeless tobacco: Never Used  . Alcohol use No    Review of Systems Constitutional: Negative for fever. Cardiovascular: Negative for chest pain. Respiratory: Negative for shortness of breath. Gastrointestinal: Negative for abdominal pain Musculoskeletal:Right hip pain Neurological: Negative for headache 10-point ROS otherwise negative.  ____________________________________________  PHYSICAL EXAM:  VITAL SIGNS: ED Triage Vitals  Enc Vitals Group     BP 08/06/16 1336 (!) 148/63     Pulse Rate 08/06/16 1336 83     Resp 08/06/16 1336 16     Temp 08/06/16 1336 98.2 F (36.8 C)     Temp Source 08/06/16 1336 Oral     SpO2 08/06/16 1336 97 %     Weight 08/06/16 1338 178 lb (80.7 kg)     Height 08/06/16 1338 5' (1.524 m)     Head Circumference --      Peak Flow --      Pain Score 08/06/16 1338 9     Pain Loc --      Pain Edu? --      Excl. in GC? --     Constitutional: Alert and  oriented. Well appearing and in no distress. Eyes: Normal exam ENT   Head: Normocephalic and atraumatic.   Mouth/Throat: Mucous membranes are moist. Cardiovascular: Normal rate, regular rhythm. No murmur Respiratory: Normal respiratory effort without tachypnea nor retractions. Breath sounds are clear Gastrointestinal: Soft and nontender. No distention.   Musculoskeletal: 2+ DP pulse in the right leg, neurovascularly intact, able to range the right hip with minimal discomfort. Moderate tenderness to palpation of the right hip. No edema. Neurologic:  Normal speech and language. No gross focal neurologic deficits Skin:  Skin is warm, dry and intact.  Psychiatric: Mood and affect are normal.   ____________________________________________   RADIOLOGY  X-ray negative  ____________________________________________   INITIAL IMPRESSION / ASSESSMENT AND PLAN / ED COURSE  Pertinent labs & imaging results that were available during my care of the patient were reviewed by me and considered in my medical decision making (see chart for details).  The patient presents to the emergency department with right hip pain starting yesterday. Patient states while ambulating she felt a pop and has since had pain and tingling down her right leg. Patient's x-ray is negative. We'll proceed with a CT scan of the right hip to rule out occult fracture. We will treat the patient's discomfort while awaiting CT results.  CT negative. Patient states her pain is much better after Percocet. Patient ambulates in the emergency department. We'll discharge home with a short course of pain medication and orthopedic follow-up. Patient agreeable.  ____________________________________________   FINAL CLINICAL IMPRESSION(S) / ED DIAGNOSES  Right hip pain    Minna AntisKevin Mishka Stegemann, MD 08/06/16 1717

## 2016-09-26 ENCOUNTER — Encounter: Payer: Self-pay | Admitting: *Deleted

## 2016-09-26 ENCOUNTER — Ambulatory Visit
Admission: EM | Admit: 2016-09-26 | Discharge: 2016-09-26 | Disposition: A | Discharge status: Home / Self Care | Payer: Medicare Other | Attending: Family Medicine | Admitting: Family Medicine

## 2016-09-26 DIAGNOSIS — M791 Myalgia: Secondary | ICD-10-CM

## 2016-09-26 DIAGNOSIS — R05 Cough: Secondary | ICD-10-CM | POA: Diagnosis not present

## 2016-09-26 DIAGNOSIS — R059 Cough, unspecified: Secondary | ICD-10-CM

## 2016-09-26 DIAGNOSIS — J0100 Acute maxillary sinusitis, unspecified: Secondary | ICD-10-CM

## 2016-09-26 DIAGNOSIS — R0981 Nasal congestion: Secondary | ICD-10-CM

## 2016-09-26 HISTORY — DX: Depression, unspecified: F32.A

## 2016-09-26 HISTORY — DX: Major depressive disorder, single episode, unspecified: F32.9

## 2016-09-26 HISTORY — DX: Anxiety disorder, unspecified: F41.9

## 2016-09-26 MED ORDER — HYDROCOD POLST-CPM POLST ER 10-8 MG/5ML PO SUER: 5.0000 mL | Freq: Two times a day (BID) | ORAL | 0 refills | Status: DC | PRN

## 2016-09-26 MED ORDER — DOXYCYCLINE HYCLATE 100 MG PO TABS: 100.0000 mg | ORAL_TABLET | Freq: Two times a day (BID) | ORAL | 0 refills | Status: DC

## 2016-09-26 NOTE — ED Provider Notes (Signed)
[facility]    CSN: [id number] Arrival date & time: 09/26/16  1923     History   Chief Complaint Chief Complaint  Patient presents with  . Nasal Congestion  . Headache  . Generalized Body Aches    HPI Courtney [name] is a 61 y.o. female.   The history is provided by the patient.  Headache  Associated symptoms: congestion, cough, facial pain, fatigue, fever and URI   URI  Presenting symptoms: congestion, cough, facial pain, fatigue and fever   Severity:  Moderate Onset quality:  Sudden Duration:  10 days Timing:  Constant Progression:  Worsening Chronicity:  New Relieved by:  Nothing Ineffective treatments:  OTC medications Associated symptoms: headaches and sinus pain   Associated symptoms: no wheezing   Risk factors: diabetes mellitus   Risk factors: not elderly, no chronic cardiac disease, no chronic kidney disease, no chronic respiratory disease, no immunosuppression, no recent illness, no recent travel and no sick contacts     Past Medical History:  Diagnosis Date  . Anxiety and depression   . Chronic back pain   . Diabetes mellitus without complication (HCC)   . Hypertension     Patient Active Problem List   Diagnosis Date Noted  . Bad headache 01/15/2016    Past Surgical History:  Procedure Laterality Date  . BACK SURGERY    . HEMORROIDECTOMY    . right shoulder surgery       OB History    No data available       Home Medications    Prior to Admission medications   Medication Sig Start Date End Date Taking? Authorizing Provider  cholecalciferol (VITAMIN D) 1000 units tablet Take 1,000 Units by mouth daily.   Yes Historical Provider, MD  clonazePAM (KLONOPIN) 1 MG tablet Take 1 mg by mouth daily.   Yes Historical Provider, MD  co-enzyme Q-10 50 MG capsule Take 100 mg by mouth daily.   Yes Historical Provider, MD  cyclobenzaprine (FLEXERIL) 5 MG tablet Take 5 mg by mouth 3 (three) times daily.   Yes Historical Provider, MD    fluticasone (FLONASE) 50 MCG/ACT nasal spray Place 2 sprays into both nostrils daily.   Yes Historical Provider, MD  loratadine (CLARITIN) 10 MG tablet Take 10 mg by mouth daily.   Yes Historical Provider, MD  metFORMIN (GLUCOPHAGE) 500 MG tablet Take 500 mg by mouth 2 (two) times daily with a meal.   Yes Historical Provider, MD  olmesartan-hydrochlorothiazide (BENICAR HCT) 40-12.5 MG tablet Take 1 tablet by mouth daily.   Yes Historical Provider, MD  omeprazole (PRILOSEC) 20 MG capsule Take 20 mg by mouth daily.   Yes Historical Provider, MD  promethazine (PHENERGAN) 12.5 MG tablet Take 1 tablet (12.5 mg total) by mouth every 6 (six) hours as needed for nausea or vomiting. 01/15/16  Yes Jennye Moccasin, MD  sitaGLIPtin (JANUVIA) 100 MG tablet Take 100 mg by mouth daily.   Yes Historical Provider, MD  zolpidem (AMBIEN) 10 MG tablet Take 10 mg by mouth at bedtime.   Yes Historical Provider, MD  chlorpheniramine-HYDROcodone (TUSSIONEX PENNKINETIC ER) 10-8 MG/5ML SUER Take 5 mLs by mouth every 12 (twelve) hours as needed. 09/26/16   Payton Mccallum, MD  doxycycline (VIBRA-TABS) 100 MG tablet Take 1 tablet (100 mg total) by mouth 2 (two) times daily. 09/26/16   Payton Mccallum, MD  HYDROcodone-acetaminophen (NORCO/VICODIN) 5-325 MG tablet Take 1 tablet by mouth every 4 (four) hours as needed. 08/06/16   Minna Antis,  MD  HYDROmorphone (DILAUDID) 2 MG tablet Take 1 tablet (2 mg total) by mouth every 12 (twelve) hours as needed for severe pain. 01/15/16   Jennye Moccasin, MD  metoCLOPramide (REGLAN) 10 MG tablet Take 1 tablet (10 mg total) by mouth every 6 (six) hours as needed for nausea (nausea/headache). 05/01/16   Charlynne Pander, MD  oxyCODONE-acetaminophen (PERCOCET) 7.5-325 MG tablet Take 1 tablet by mouth every 4 (four) hours as needed for severe pain. 04/05/16 04/05/17  Emily Filbert, MD  promethazine (PHENERGAN) 12.5 MG suppository Place 1 suppository (12.5 mg total) rectally every 6 (six)  hours as needed for nausea or vomiting. 01/15/16   Jennye Moccasin, MD    Family History History reviewed. No pertinent family history.  Social History Social History  Substance Use Topics  . Smoking status: Never Smoker  . Smokeless tobacco: Never Used  . Alcohol use No     Allergies   Ace inhibitors; Codeine; Lidoderm [lidocaine]; Naproxen; Niacin and related; Penicillins; Pravastatin; Prednisone; Simvastatin; and Tramadol   Review of Systems Review of Systems  Constitutional: Positive for fatigue and fever.  HENT: Positive for congestion and sinus pain.   Respiratory: Positive for cough. Negative for wheezing.   Neurological: Positive for headaches.     Physical Exam Triage Vital Signs ED Triage Vitals  Enc Vitals Group     BP 09/26/16 1933 123/71     Pulse Rate 09/26/16 1933 97     Resp 09/26/16 1933 18     Temp 09/26/16 1933 98 F (36.7 C)     Temp Source 09/26/16 1933 Oral     SpO2 09/26/16 1933 99 %     Weight 09/26/16 1935 174 lb (78.9 kg)     Height 09/26/16 1935  (1.549 m)     Head Circumference --      Peak Flow --      Pain Score 09/26/16 1935 9     Pain Loc --      Pain Edu? --      Excl. in GC? --    No data found.   Updated Vital Signs BP 123/71 (BP Location: Left Arm)   Pulse 97   Temp 98 F (36.7 C) (Oral)   Resp 18   Ht  (1.549 m)   Wt 174 lb (78.9 kg)   SpO2 99%   BMI 32.88 kg/m   Visual Acuity Right Eye Distance:   Left Eye Distance:   Bilateral Distance:    Right Eye Near:   Left Eye Near:    Bilateral Near:     Physical Exam  Constitutional: She appears well-developed and well-nourished. No distress.  HENT:  Head: Normocephalic and atraumatic.  Right Ear: Tympanic membrane, external ear and ear canal normal.  Left Ear: Tympanic membrane, external ear and ear canal normal.  Nose: Mucosal edema and rhinorrhea present. No nose lacerations, sinus tenderness, nasal deformity, septal deviation or nasal septal  hematoma. No epistaxis.  No foreign bodies. Right sinus exhibits maxillary sinus tenderness and frontal sinus tenderness. Left sinus exhibits maxillary sinus tenderness and frontal sinus tenderness.  Mouth/Throat: Uvula is midline, oropharynx is clear and moist and mucous membranes are normal. No oropharyngeal exudate.  Eyes: Conjunctivae and EOM are normal. Pupils are equal, round, and reactive to light. Right eye exhibits no discharge. Left eye exhibits no discharge. No scleral icterus.  Neck: Normal range of motion. Neck supple. No thyromegaly present.  Cardiovascular: Normal rate, regular rhythm and  normal heart sounds.   Pulmonary/Chest: Effort normal and breath sounds normal. No respiratory distress. She has no wheezes. She has no rales.  Lymphadenopathy:    She has no cervical adenopathy.  Skin: She is not diaphoretic.  Nursing note and vitals reviewed.    UC Treatments / Results  Labs (all labs ordered are listed, but only abnormal results are displayed) Labs Reviewed - No data to display  EKG  EKG Interpretation None       Radiology No results found.  Procedures Procedures (including critical care time)  Medications Ordered in UC Medications - No data to display   Initial Impression / Assessment and Plan / UC Course  I have reviewed the triage vital signs and the nursing notes.  Pertinent labs & imaging results that were available during my care of the patient were reviewed by me and considered in my medical decision making (see chart for details).      Final Clinical Impressions(s) / UC Diagnoses   Final diagnoses:  Acute maxillary sinusitis, recurrence not specified  Cough    New Prescriptions Discharge Medication List as of 09/26/2016  8:18 PM    START taking these medications   Details  chlorpheniramine-HYDROcodone (TUSSIONEX PENNKINETIC ER) 10-8 MG/5ML SUER Take 5 mLs by mouth every 12 (twelve) hours as needed., Starting Wed 09/26/2016, Normal      doxycycline (VIBRA-TABS) 100 MG tablet Take 1 tablet (100 mg total) by mouth 2 (two) times daily., Starting Wed 09/26/2016, Normal       1. diagnosis reviewed with patient 2. rx as per orders above; reviewed possible side effects, interactions, risks and benefits  3. Recommend supportive treatment with otc analgesics, otc flonase 4. Follow-up prn if symptoms worsen or don't improve   Payton Mccallum, MD 09/26/16 2030

## 2016-09-26 NOTE — ED Triage Notes (Signed)
Patient started having symptoms of body aches, cough, nasal congestion, fever and headaches 1 week ago.

## 2016-09-27 NOTE — ED Provider Notes (Signed)
Notified by front desk staff that patient's Medicare does not cover Tussionex cough syrup. Patient has codeine listed as an allergy. We'll prescribe Tessalon 200 mg 1 capsule 3 times a day when necessary cough #30.  called this into [facility] at [address] in [place]. will have staff notify pt that rx is in.    Domenick Gong, MD 09/27/16 [phone]

## 2017-03-19 ENCOUNTER — Other Ambulatory Visit: Payer: Self-pay | Admitting: Orthopedic Surgery

## 2017-03-19 DIAGNOSIS — M7542 Impingement syndrome of left shoulder: Secondary | ICD-10-CM

## 2017-03-27 ENCOUNTER — Ambulatory Visit
Admission: RE | Admit: 2017-03-27 | Discharge: 2017-03-27 | Disposition: A | Discharge status: Home / Self Care | Payer: Medicare Other | Source: Ambulatory Visit | Attending: Orthopedic Surgery | Admitting: Orthopedic Surgery

## 2017-03-27 DIAGNOSIS — M7552 Bursitis of left shoulder: Secondary | ICD-10-CM | POA: Diagnosis not present

## 2017-03-27 DIAGNOSIS — M659 Synovitis and tenosynovitis, unspecified: Secondary | ICD-10-CM | POA: Insufficient documentation

## 2017-03-27 DIAGNOSIS — M75102 Unspecified rotator cuff tear or rupture of left shoulder, not specified as traumatic: Secondary | ICD-10-CM | POA: Diagnosis not present

## 2017-03-27 DIAGNOSIS — M7542 Impingement syndrome of left shoulder: Secondary | ICD-10-CM | POA: Diagnosis not present

## 2017-04-05 ENCOUNTER — Other Ambulatory Visit: Payer: Self-pay | Admitting: Orthopedic Surgery

## 2017-04-23 ENCOUNTER — Other Ambulatory Visit: Payer: Self-pay

## 2017-04-23 ENCOUNTER — Ambulatory Visit
Admission: EM | Admit: 2017-04-23 | Discharge: 2017-04-23 | Disposition: A | Discharge status: Home / Self Care | Payer: Medicare Other | Attending: Family Medicine | Admitting: Family Medicine

## 2017-04-23 DIAGNOSIS — S39012A Strain of muscle, fascia and tendon of lower back, initial encounter: Secondary | ICD-10-CM | POA: Diagnosis not present

## 2017-04-23 DIAGNOSIS — M25551 Pain in right hip: Secondary | ICD-10-CM

## 2017-04-23 MED ORDER — TIZANIDINE HCL 4 MG PO CAPS: 4.0000 mg | ORAL_CAPSULE | Freq: Three times a day (TID) | ORAL | 0 refills | Status: DC

## 2017-04-23 NOTE — Discharge Instructions (Signed)
Not take Flexeril while taking new medication ( tizanidine). Recommend using a walker for ambulation because of your unsteadiness. Recommend follow-up with your primary care and orthopedic surgeon as soon as possible.

## 2017-04-23 NOTE — ED Provider Notes (Signed)
[facility]    CSN: [id number] Arrival date & time: 04/23/17  1937     History   Chief Complaint Chief Complaint  Patient presents with  . Back Pain    HPI Courtney [name] is a 61 y.o. female.   HPI  This is a 61 year old female who presents with a several  complaints including left-sided low back pain and right lateral hip pain that radiates into her groin. He states that she lifted a wheelchair that caused her to have her current problems. Has been seeing an orthopedic surgeon for the hip bursitis and has had several bursal injections in the past. He also is status post lumbar surgery in the past and has recurrent back ache on occasion. States that she is at a point now it's her pain is so severe she "can't stand it anymore". Review her medical records she is allergic to numerous medications limiting her treatment options. He is not in a pain medicine relation ship at the present time but has been in the past. She explains that she has had epidural steroid injections in the past. There are not performing those anymore. She is having difficulty walking because of her hip pain and her back pain        Past Medical History:  Diagnosis Date  . Anxiety and depression   . Chronic back pain   . Diabetes mellitus without complication (HCC)   . Hypertension     Patient Active Problem List   Diagnosis Date Noted  . Bad headache 01/15/2016    Past Surgical History:  Procedure Laterality Date  . BACK SURGERY    . HEMORROIDECTOMY    . right shoulder surgery       OB History    No data available       Home Medications    Prior to Admission medications   Medication Sig Start Date End Date Taking? Authorizing Provider  cholecalciferol (VITAMIN D) 1000 units tablet Take 1,000 Units by mouth daily.    [provider]  clonazePAM (KLONOPIN) 1 MG tablet Take 1 mg by mouth daily.    [provider]  cyclobenzaprine (FLEXERIL) 5 MG tablet Take 5 mg  by mouth 3 (three) times daily.    [provider]  fluticasone (FLONASE) 50 MCG/ACT nasal spray Place 2 sprays into both nostrils daily.    [provider]  loratadine (CLARITIN) 10 MG tablet Take 10 mg by mouth daily.    [provider]  metFORMIN (GLUCOPHAGE) 500 MG tablet Take 500 mg by mouth 2 (two) times daily with a meal.    [provider]  olmesartan-hydrochlorothiazide (BENICAR HCT) 40-12.5 MG tablet Take 1 tablet by mouth daily.    [provider]  omeprazole (PRILOSEC) 20 MG capsule Take 20 mg by mouth daily.    [provider]  promethazine (PHENERGAN) 12.5 MG suppository Place 1 suppository (12.5 mg total) rectally every 6 (six) hours as needed for nausea or vomiting. 01/15/16   Jennye MoccasinQuigley, Brian S, MD  promethazine (PHENERGAN) 12.5 MG tablet Take 1 tablet (12.5 mg total) by mouth every 6 (six) hours as needed for nausea or vomiting. 01/15/16   Jennye MoccasinQuigley, Brian S, MD  sitaGLIPtin (JANUVIA) 100 MG tablet Take 100 mg by mouth daily.    [provider]  tiZANidine (ZANAFLEX) 4 MG capsule Take 1 capsule (4 mg total) 3 (three) times daily by mouth. 04/23/17   Lutricia Feiloemer, Idriss Quackenbush P, PA-C  zolpidem (AMBIEN) 10 MG tablet Take  10 mg by mouth at bedtime.    [provider]    Family History History reviewed. No pertinent family history.  Social History Social History   Tobacco Use  . Smoking status: Never Smoker  . Smokeless tobacco: Never Used  Substance Use Topics  . Alcohol use: No  . Drug use: No     Allergies   Ace inhibitors; Codeine; Lidoderm [lidocaine]; Naproxen; Niacin and related; Penicillins; Pravastatin; Prednisone; Simvastatin; and Tramadol   Review of Systems Review of Systems  Constitutional: Positive for activity change. Negative for chills, fatigue and fever.  Musculoskeletal: Positive for back pain, gait problem and myalgias.  All other systems reviewed and are negative.    Physical Exam Triage  Vital Signs ED Triage Vitals  Enc Vitals Group     BP 04/23/17 1949 (!) 145/62     Pulse Rate 04/23/17 1949 88     Resp 04/23/17 1949 16     Temp 04/23/17 1949 98.4 F (36.9 C)     Temp Source 04/23/17 1949 Oral     SpO2 04/23/17 1949 97 %     Weight 04/23/17 1949 173 lb (78.5 kg)     Height 04/23/17 1949 5' 0.5" (1.537 m)     Head Circumference --      Peak Flow --      Pain Score 04/23/17 1946 10     Pain Loc --      Pain Edu? --      Excl. in GC? --    No data found.  Updated Vital Signs BP (!) 145/62 (BP Location: Left Arm)   Pulse 88   Temp 98.4 F (36.9 C) (Oral)   Resp 16   Ht 5' 0.5" (1.537 m)   Wt 173 lb (78.5 kg)   SpO2 97%   BMI 33.23 kg/m   Visual Acuity Right Eye Distance:   Left Eye Distance:   Bilateral Distance:    Right Eye Near:   Left Eye Near:    Bilateral Near:     Physical Exam  Constitutional: She is oriented to person, place, and time. She appears well-developed and well-nourished. No distress.  HENT:  Head: Normocephalic.  Eyes: Pupils are equal, round, and reactive to light.  Neck: Normal range of motion.  Musculoskeletal: She exhibits tenderness.  Semination of the lumbar spine shows a well-healed midline surgical incision the lower lumbar segments. The patient A tendency to favor forward flexion. She has a marked antalgic gait bilaterally. She tends to shuffle. She has a tendency to stand with a slight forward flexion. Range of motion is markedly limited to all attempts to move. Is tenderness to light palpation and over the left sacroiliac joint. So has extreme discomfort with that palpation over the right greater trochanteric region.  Neurological: She is alert and oriented to person, place, and time.  Skin: Skin is warm and dry. She is not diaphoretic.  Psychiatric: She has a normal mood and affect. Her behavior is normal. Judgment and thought content normal.  Nursing note and vitals reviewed.    UC Treatments / Results   Labs (all labs ordered are listed, but only abnormal results are displayed) Labs Reviewed - No data to display  EKG  EKG Interpretation None       Radiology No results found.  Procedures Procedures (including critical care time)  Medications Ordered in UC Medications - No data to display   Initial Impression / Assessment and Plan / UC Course  I  have reviewed the triage vital signs and the nursing notes.  Pertinent labs & imaging results that were available during my care of the patient were reviewed by me and considered in my medical decision making (see chart for details).     Plan: 1. Test/x-ray results and diagnosis reviewed with patient 2. rx as per orders; risks, benefits, potential side effects reviewed with patient 3. Recommend supportive treatment with rest and symptom avoidance is much as feasible. I told her that she has so many allergies that she limits my treatment options at this point. I will try to help her with her pain by discontinuing her Flexeril and starting her on Zanaflex. The patient really needs to be in a pain management practice. I have also explained that she should not be lifting heavy items with her previous back injuries. His appointment next week with her primary care physician I've asked her to be certain to advise them of her recent exacerbation of pain. She would also benefit highly from revisiting the orthopedic surgeon. I also suggested she use a walker for stabilization when she ambulates. 4. F/u prn if symptoms worsen or don't improve   Final Clinical Impressions(s) / UC Diagnoses   Final diagnoses:  Strain of lumbar region, initial encounter    ED Discharge Orders        Ordered    tiZANidine (ZANAFLEX) 4 MG capsule  3 times daily     04/23/17 2009       Controlled Substance Prescriptions [place] Controlled Substance Registry consulted? Not Applicable   Lutricia FeilRoemer, Melenda Bielak P, PA-C 04/23/17 2030
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
## 2017-04-28 ENCOUNTER — Emergency Department: Payer: Medicare Other

## 2017-04-28 ENCOUNTER — Encounter: Payer: Self-pay | Admitting: Emergency Medicine

## 2017-04-28 ENCOUNTER — Other Ambulatory Visit: Payer: Self-pay

## 2017-04-28 ENCOUNTER — Emergency Department
Admission: EM | Admit: 2017-04-28 | Discharge: 2017-04-28 | Disposition: A | Discharge status: Home / Self Care | Payer: Medicare Other | Attending: Emergency Medicine | Admitting: Emergency Medicine

## 2017-04-28 DIAGNOSIS — Z7984 Long term (current) use of oral hypoglycemic drugs: Secondary | ICD-10-CM | POA: Insufficient documentation

## 2017-04-28 DIAGNOSIS — Z79899 Other long term (current) drug therapy: Secondary | ICD-10-CM | POA: Diagnosis not present

## 2017-04-28 DIAGNOSIS — M545 Low back pain: Secondary | ICD-10-CM | POA: Diagnosis not present

## 2017-04-28 DIAGNOSIS — M549 Dorsalgia, unspecified: Secondary | ICD-10-CM | POA: Diagnosis present

## 2017-04-28 DIAGNOSIS — G8929 Other chronic pain: Secondary | ICD-10-CM | POA: Insufficient documentation

## 2017-04-28 DIAGNOSIS — E119 Type 2 diabetes mellitus without complications: Secondary | ICD-10-CM | POA: Insufficient documentation

## 2017-04-28 DIAGNOSIS — I10 Essential (primary) hypertension: Secondary | ICD-10-CM | POA: Diagnosis not present

## 2017-04-28 MED ORDER — OXYCODONE-ACETAMINOPHEN 5-325 MG PO TABS: 1.0000 | ORAL_TABLET | Freq: Four times a day (QID) | ORAL | 0 refills | Status: DC | PRN

## 2017-04-28 MED ORDER — ORPHENADRINE CITRATE 30 MG/ML IJ SOLN
60.0000 mg | Freq: Two times a day (BID) | INTRAMUSCULAR | Status: DC
  Administered 2017-04-28: 60 mg via INTRAMUSCULAR
  Filled 2017-04-28: qty 2

## 2017-04-28 MED ORDER — OXYCODONE-ACETAMINOPHEN 5-325 MG PO TABS
1.0000 | ORAL_TABLET | Freq: Once | ORAL | Status: AC
  Administered 2017-04-28: 1 via ORAL
  Filled 2017-04-28: qty 1
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
## 2017-04-28 NOTE — Discharge Instructions (Signed)
Follow up with your  primary care provider. Call tomorrow to make an appointment. Continue taking medication as directed. Percocet one every 6 hours if needed for severe pain.Do not take this medication with your Zanaflex as it would increase drowsiness and increase your  risk for falling. Se ice or heat to your back as needed for comfort.
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
## 2017-04-28 NOTE — ED Notes (Signed)
Patient states that current back pain symptoms are an exacerbation of her prior chronic back pain

## 2017-04-30 ENCOUNTER — Other Ambulatory Visit: Payer: Medicare Other

## 2017-05-16 ENCOUNTER — Inpatient Hospital Stay: Admission: RE | Admit: 2017-05-16 | Payer: Medicare Other | Source: Ambulatory Visit

## 2017-05-17 ENCOUNTER — Encounter
Admission: RE | Admit: 2017-05-17 | Discharge: 2017-05-17 | Disposition: A | Discharge status: Home / Self Care | Payer: Medicare Other | Source: Ambulatory Visit | Attending: Orthopedic Surgery | Admitting: Orthopedic Surgery

## 2017-05-17 ENCOUNTER — Other Ambulatory Visit: Payer: Self-pay

## 2017-05-17 DIAGNOSIS — Z01812 Encounter for preprocedural laboratory examination: Secondary | ICD-10-CM | POA: Insufficient documentation

## 2017-05-17 DIAGNOSIS — Z01810 Encounter for preprocedural cardiovascular examination: Secondary | ICD-10-CM | POA: Insufficient documentation

## 2017-05-17 HISTORY — DX: Obesity, unspecified: E66.9

## 2017-05-17 HISTORY — DX: Gastro-esophageal reflux disease without esophagitis: K21.9

## 2017-05-17 HISTORY — DX: Trochanteric bursitis, unspecified hip: M70.60

## 2017-05-17 HISTORY — DX: Hyperlipidemia, unspecified: E78.5

## 2017-05-17 HISTORY — DX: Insomnia, unspecified: G47.00

## 2017-05-17 HISTORY — DX: Sleep apnea, unspecified: G47.30

## 2017-05-17 LAB — PROTIME-INR
INR: 0.9
PROTHROMBIN TIME: 12.1 s (ref 11.4–15.2)

## 2017-05-17 LAB — APTT: APTT: 35 s (ref 24–36)

## 2017-05-17 NOTE — Patient Instructions (Signed)
Your procedure is scheduled on: Thursday, May 23, 2017 Report to [facility]nd [facility] To find out your arrival time, please call [phone] between 1PM - 3PM on: Wednesday, May 22, 2017  REMEMBER: Instructions that are not followed completely may result in serious medical risk, up to and including death; or upon the discretion of your surgeon and anesthesiologist your surgery may need to be rescheduled.  Do not eat food after midnight the night before your procedure.  No gum chewing or hard candies.  You may however, drink water only up to 2 hours before you are scheduled to arrive at the hospital for your procedure.  Do not drink water within 2 hours of the start of your surgery.  No Alcohol for 24 hours before or after surgery.  No Smoking including e-cigarettes for 24 hours prior to surgery. No chewable tobacco products for at least 6 hours prior to surgery. No nicotine patches on the day of surgery.  Notify your doctor if there is any change in your medical condition (cold, fever, infection).  Do not wear jewelry, make-up, hairpins, clips or nail polish.  Do not wear lotions, powders, or perfumes. You may wear deodorant.  Do not shave 48 hours prior to surgery.   Contacts and dentures may not be worn into surgery.  Do not bring valuables to the hospital. [facility] is not responsible for any belongings or valuables.   TAKE THESE MEDICATIONS THE MORNING OF SURGERY WITH A SIP OF WATER:  1.  OMEPRAZOLE (take one capsule the night before surgery and one capsule the morning of surgery - helps to prevent nausea after surgery)  Use CHG Soap as directed on instruction sheet.  Bring your C-PAP to the hospital with you in case you may have to spend the night.  Stop Metformin 2 days prior to surgery. Last dose to be taken on Monday, May 20, 2017  NOW!  Stop ASPIRIN AND Anti-inflammatories such as Advil, Aleve, Ibuprofen, Motrin,  Naproxen, Naprosyn, Goodie powder, or aspirin products. (May take Tylenol or Acetaminophen if needed.)  Stop ANY OVER THE COUNTER supplements until after surgery. (May continue Vitamin D)  If you are being discharged the day of surgery, you will not be allowed to drive home. You will need someone to drive you home and stay with you that night.   If you are taking public transportation, you will need to have a responsible adult to with you.  Please call the number above if you have any questions about these instructions.

## 2017-06-24 ENCOUNTER — Inpatient Hospital Stay: Admission: RE | Admit: 2017-06-24 | Payer: Medicare Other | Source: Ambulatory Visit

## 2017-06-25 ENCOUNTER — Encounter: Payer: Self-pay | Admitting: *Deleted

## 2017-06-25 ENCOUNTER — Encounter
Admission: RE | Disposition: A | Discharge status: Home / Self Care | Payer: Self-pay | Source: Ambulatory Visit | Attending: Orthopedic Surgery

## 2017-06-25 ENCOUNTER — Ambulatory Visit: Payer: Medicare Other | Admitting: Anesthesiology

## 2017-06-25 ENCOUNTER — Observation Stay
Admission: RE | Admit: 2017-06-25 | Discharge: 2017-06-27 | Disposition: A | Discharge status: Home / Self Care | Payer: Medicare Other | Source: Ambulatory Visit | Attending: Orthopedic Surgery | Admitting: Orthopedic Surgery

## 2017-06-25 ENCOUNTER — Other Ambulatory Visit: Payer: Self-pay

## 2017-06-25 DIAGNOSIS — K219 Gastro-esophageal reflux disease without esophagitis: Secondary | ICD-10-CM | POA: Insufficient documentation

## 2017-06-25 DIAGNOSIS — M7552 Bursitis of left shoulder: Secondary | ICD-10-CM | POA: Insufficient documentation

## 2017-06-25 DIAGNOSIS — M21372 Foot drop, left foot: Secondary | ICD-10-CM | POA: Diagnosis not present

## 2017-06-25 DIAGNOSIS — M25812 Other specified joint disorders, left shoulder: Principal | ICD-10-CM | POA: Insufficient documentation

## 2017-06-25 DIAGNOSIS — Z6837 Body mass index (BMI) 37.0-37.9, adult: Secondary | ICD-10-CM | POA: Diagnosis not present

## 2017-06-25 DIAGNOSIS — F329 Major depressive disorder, single episode, unspecified: Secondary | ICD-10-CM | POA: Insufficient documentation

## 2017-06-25 DIAGNOSIS — E1165 Type 2 diabetes mellitus with hyperglycemia: Secondary | ICD-10-CM | POA: Diagnosis not present

## 2017-06-25 DIAGNOSIS — I10 Essential (primary) hypertension: Secondary | ICD-10-CM | POA: Insufficient documentation

## 2017-06-25 DIAGNOSIS — R51 Headache: Secondary | ICD-10-CM | POA: Diagnosis not present

## 2017-06-25 DIAGNOSIS — Z9989 Dependence on other enabling machines and devices: Secondary | ICD-10-CM | POA: Diagnosis not present

## 2017-06-25 DIAGNOSIS — J029 Acute pharyngitis, unspecified: Secondary | ICD-10-CM | POA: Insufficient documentation

## 2017-06-25 DIAGNOSIS — M75112 Incomplete rotator cuff tear or rupture of left shoulder, not specified as traumatic: Secondary | ICD-10-CM | POA: Insufficient documentation

## 2017-06-25 DIAGNOSIS — F419 Anxiety disorder, unspecified: Secondary | ICD-10-CM | POA: Diagnosis not present

## 2017-06-25 DIAGNOSIS — E669 Obesity, unspecified: Secondary | ICD-10-CM | POA: Diagnosis not present

## 2017-06-25 DIAGNOSIS — M19012 Primary osteoarthritis, left shoulder: Secondary | ICD-10-CM | POA: Diagnosis not present

## 2017-06-25 DIAGNOSIS — E785 Hyperlipidemia, unspecified: Secondary | ICD-10-CM | POA: Diagnosis not present

## 2017-06-25 DIAGNOSIS — Z9889 Other specified postprocedural states: Secondary | ICD-10-CM

## 2017-06-25 DIAGNOSIS — G4730 Sleep apnea, unspecified: Secondary | ICD-10-CM | POA: Diagnosis not present

## 2017-06-25 HISTORY — PX: SHOULDER ARTHROSCOPY WITH OPEN ROTATOR CUFF REPAIR: SHX6092

## 2017-06-25 LAB — GLUCOSE, CAPILLARY
GLUCOSE-CAPILLARY: 326 mg/dL — AB (ref 65–99)
GLUCOSE-CAPILLARY: 322 mg/dL — AB (ref 65–99)
GLUCOSE-CAPILLARY: 240 mg/dL — AB (ref 65–99)
GLUCOSE-CAPILLARY: 320 mg/dL — AB (ref 65–99)
GLUCOSE-CAPILLARY: 411 mg/dL — AB (ref 65–99)
Glucose-Capillary: 264 mg/dL — ABNORMAL HIGH (ref 65–99)
Glucose-Capillary: 280 mg/dL — ABNORMAL HIGH (ref 65–99)

## 2017-06-25 SURGERY — ARTHROSCOPY, SHOULDER WITH REPAIR, ROTATOR CUFF, OPEN
Anesthesia: General | Site: Shoulder | Laterality: Left | Wound class: Clean

## 2017-06-25 MED ORDER — EPINEPHRINE 30 MG/30ML IJ SOLN
INTRAMUSCULAR | Status: AC
Start: 2017-06-25 — End: ?
  Filled 2017-06-25: qty 1

## 2017-06-25 MED ORDER — ACETAMINOPHEN 10 MG/ML IV SOLN
INTRAVENOUS | Status: AC
  Filled 2017-06-25: qty 100

## 2017-06-25 MED ORDER — MIDAZOLAM HCL 2 MG/2ML IJ SOLN
INTRAMUSCULAR | Status: AC
  Administered 2017-06-25: 1 mg
  Filled 2017-06-25: qty 2

## 2017-06-25 MED ORDER — ALUM & MAG HYDROXIDE-SIMETH 200-200-20 MG/5ML PO SUSP: 30.0000 mL | ORAL | Status: DC | PRN

## 2017-06-25 MED ORDER — PROMETHAZINE HCL 25 MG/ML IJ SOLN
INTRAMUSCULAR | Status: AC
  Filled 2017-06-25: qty 1

## 2017-06-25 MED ORDER — FENTANYL CITRATE (PF) 100 MCG/2ML IJ SOLN
25.0000 ug | INTRAMUSCULAR | Status: DC | PRN
  Administered 2017-06-25 (×2): 50 ug via INTRAVENOUS

## 2017-06-25 MED ORDER — BUPIVACAINE LIPOSOME 1.3 % IJ SUSP
INTRAMUSCULAR | Status: DC | PRN
  Administered 2017-06-25: 7 mL via PERINEURAL
  Administered 2017-06-25: 3 mL via PERINEURAL

## 2017-06-25 MED ORDER — ROCURONIUM BROMIDE 50 MG/5ML IV SOLN
INTRAVENOUS | Status: AC
  Filled 2017-06-25: qty 1

## 2017-06-25 MED ORDER — ACETAMINOPHEN 650 MG RE SUPP: 650.0000 mg | RECTAL | Status: DC | PRN

## 2017-06-25 MED ORDER — OXYCODONE HCL 5 MG PO TABS
10.0000 mg | ORAL_TABLET | ORAL | Status: DC | PRN
  Administered 2017-06-25 – 2017-06-27 (×4): 10 mg via ORAL
  Filled 2017-06-25 (×4): qty 2

## 2017-06-25 MED ORDER — OXYCODONE HCL 5 MG PO TABS
5.0000 mg | ORAL_TABLET | ORAL | 0 refills | Status: DC | PRN
Start: 2017-06-25 — End: 2017-10-11

## 2017-06-25 MED ORDER — INSULIN ASPART 100 UNIT/ML ~~LOC~~ SOLN
5.0000 [IU] | Freq: Once | SUBCUTANEOUS | Status: AC
  Administered 2017-06-25: 5 [IU] via SUBCUTANEOUS

## 2017-06-25 MED ORDER — INSULIN ASPART 100 UNIT/ML ~~LOC~~ SOLN
7.0000 [IU] | Freq: Once | SUBCUTANEOUS | Status: AC
  Administered 2017-06-25: 7 [IU] via SUBCUTANEOUS

## 2017-06-25 MED ORDER — INSULIN ASPART 100 UNIT/ML ~~LOC~~ SOLN
0.0000 [IU] | Freq: Every day | SUBCUTANEOUS | Status: DC
  Administered 2017-06-25: 5 [IU] via SUBCUTANEOUS
  Administered 2017-06-26: 4 [IU] via SUBCUTANEOUS
  Filled 2017-06-25 (×2): qty 1

## 2017-06-25 MED ORDER — METHOCARBAMOL 500 MG PO TABS: 500.0000 mg | ORAL_TABLET | Freq: Four times a day (QID) | ORAL | Status: DC | PRN

## 2017-06-25 MED ORDER — NEOMYCIN-POLYMYXIN B GU 40-200000 IR SOLN
Status: DC | PRN
  Administered 2017-06-25: 2 mL

## 2017-06-25 MED ORDER — PROMETHAZINE HCL 25 MG/ML IJ SOLN
INTRAMUSCULAR | Status: AC
  Administered 2017-06-25: 6.25 mg via INTRAVENOUS
  Filled 2017-06-25: qty 1

## 2017-06-25 MED ORDER — IRBESARTAN 150 MG PO TABS
300.0000 mg | ORAL_TABLET | Freq: Every day | ORAL | Status: DC
  Administered 2017-06-25 – 2017-06-27 (×3): 300 mg via ORAL
  Filled 2017-06-25 (×3): qty 2

## 2017-06-25 MED ORDER — VITAMIN D 1000 UNITS PO TABS
2000.0000 [IU] | ORAL_TABLET | Freq: Every day | ORAL | Status: DC
  Administered 2017-06-26 – 2017-06-27 (×2): 2000 [IU] via ORAL
  Filled 2017-06-25 (×2): qty 2

## 2017-06-25 MED ORDER — BUPIVACAINE HCL (PF) 0.5 % IJ SOLN
INTRAMUSCULAR | Status: AC
  Filled 2017-06-25: qty 60

## 2017-06-25 MED ORDER — PROMETHAZINE HCL 25 MG/ML IJ SOLN
6.2500 mg | Freq: Once | INTRAMUSCULAR | Status: AC
  Administered 2017-06-25: 6.25 mg via INTRAVENOUS

## 2017-06-25 MED ORDER — EPINEPHRINE PF 1 MG/ML IJ SOLN
INTRAMUSCULAR | Status: DC | PRN
  Administered 2017-06-25: 12 mL

## 2017-06-25 MED ORDER — DEXTROSE 5 % IV SOLN
1.0000 g | Freq: Four times a day (QID) | INTRAVENOUS | Status: AC
  Administered 2017-06-25 – 2017-06-26 (×3): 1 g via INTRAVENOUS
  Filled 2017-06-25 (×4): qty 10

## 2017-06-25 MED ORDER — INSULIN ASPART 100 UNIT/ML ~~LOC~~ SOLN
SUBCUTANEOUS | Status: AC
  Filled 2017-06-25: qty 1

## 2017-06-25 MED ORDER — ONDANSETRON HCL 4 MG PO TABS: 4.0000 mg | ORAL_TABLET | Freq: Four times a day (QID) | ORAL | Status: DC | PRN

## 2017-06-25 MED ORDER — POLYETHYLENE GLYCOL 3350 17 G PO PACK
17.0000 g | PACK | Freq: Every day | ORAL | Status: DC | PRN
  Administered 2017-06-26: 17 g via ORAL
  Filled 2017-06-25: qty 1

## 2017-06-25 MED ORDER — GLIPIZIDE-METFORMIN HCL 5-500 MG PO TABS: 2.0000 | ORAL_TABLET | Freq: Two times a day (BID) | ORAL | Status: DC

## 2017-06-25 MED ORDER — OXYCODONE HCL 5 MG/5ML PO SOLN: 5.0000 mg | Freq: Once | ORAL | Status: AC | PRN

## 2017-06-25 MED ORDER — BUPIVACAINE HCL 0.25 % IJ SOLN
INTRAMUSCULAR | Status: DC | PRN
  Administered 2017-06-25: 30 mL

## 2017-06-25 MED ORDER — ZOLPIDEM TARTRATE 5 MG PO TABS
5.0000 mg | ORAL_TABLET | Freq: Every evening | ORAL | Status: DC | PRN
  Administered 2017-06-26 (×2): 5 mg via ORAL
  Filled 2017-06-25 (×2): qty 1

## 2017-06-25 MED ORDER — HYDROCHLOROTHIAZIDE 12.5 MG PO CAPS
12.5000 mg | ORAL_CAPSULE | Freq: Every day | ORAL | Status: DC
  Administered 2017-06-25 – 2017-06-27 (×3): 12.5 mg via ORAL
  Filled 2017-06-25 (×3): qty 1

## 2017-06-25 MED ORDER — FENTANYL CITRATE (PF) 100 MCG/2ML IJ SOLN
INTRAMUSCULAR | Status: AC
  Filled 2017-06-25: qty 2

## 2017-06-25 MED ORDER — PANTOPRAZOLE SODIUM 40 MG PO TBEC
40.0000 mg | DELAYED_RELEASE_TABLET | Freq: Every day | ORAL | Status: DC
  Administered 2017-06-26 – 2017-06-27 (×2): 40 mg via ORAL
  Filled 2017-06-25 (×2): qty 1

## 2017-06-25 MED ORDER — BISACODYL 10 MG RE SUPP: 10.0000 mg | Freq: Every day | RECTAL | Status: DC | PRN

## 2017-06-25 MED ORDER — METHOCARBAMOL 1000 MG/10ML IJ SOLN
500.0000 mg | Freq: Four times a day (QID) | INTRAVENOUS | Status: DC | PRN
  Filled 2017-06-25: qty 5

## 2017-06-25 MED ORDER — ONDANSETRON HCL 4 MG/2ML IJ SOLN: 4.0000 mg | Freq: Four times a day (QID) | INTRAMUSCULAR | Status: DC | PRN

## 2017-06-25 MED ORDER — ASPIRIN EC 81 MG PO TBEC
81.0000 mg | DELAYED_RELEASE_TABLET | ORAL | Status: DC
  Administered 2017-06-27: 81 mg via ORAL
  Filled 2017-06-25: qty 1

## 2017-06-25 MED ORDER — VANCOMYCIN HCL IN DEXTROSE 1-5 GM/200ML-% IV SOLN
INTRAVENOUS | Status: AC
  Filled 2017-06-25: qty 200

## 2017-06-25 MED ORDER — OXYCODONE HCL 5 MG PO TABS
ORAL_TABLET | ORAL | Status: AC
  Filled 2017-06-25: qty 1

## 2017-06-25 MED ORDER — DEXAMETHASONE SODIUM PHOSPHATE 10 MG/ML IJ SOLN
INTRAMUSCULAR | Status: DC | PRN
  Administered 2017-06-25: 5 mg via INTRAVENOUS

## 2017-06-25 MED ORDER — GLIPIZIDE 10 MG PO TABS
10.0000 mg | ORAL_TABLET | Freq: Two times a day (BID) | ORAL | Status: DC
  Administered 2017-06-25 – 2017-06-27 (×5): 10 mg via ORAL
  Filled 2017-06-25 (×5): qty 1

## 2017-06-25 MED ORDER — OLMESARTAN MEDOXOMIL-HCTZ 40-12.5 MG PO TABS: 1.0000 | ORAL_TABLET | Freq: Every day | ORAL | Status: DC

## 2017-06-25 MED ORDER — ACETAMINOPHEN 500 MG PO TABS
1000.0000 mg | ORAL_TABLET | Freq: Four times a day (QID) | ORAL | Status: AC
  Administered 2017-06-25 – 2017-06-26 (×4): 1000 mg via ORAL
  Filled 2017-06-25 (×4): qty 2

## 2017-06-25 MED ORDER — LINAGLIPTIN 5 MG PO TABS
5.0000 mg | ORAL_TABLET | Freq: Every day | ORAL | Status: DC
  Administered 2017-06-26 – 2017-06-27 (×2): 5 mg via ORAL
  Filled 2017-06-25 (×2): qty 1

## 2017-06-25 MED ORDER — DIPHENHYDRAMINE HCL 12.5 MG/5ML PO ELIX: 12.5000 mg | ORAL_SOLUTION | ORAL | Status: DC | PRN

## 2017-06-25 MED ORDER — PROPOFOL 10 MG/ML IV BOLUS
INTRAVENOUS | Status: AC
  Filled 2017-06-25: qty 20

## 2017-06-25 MED ORDER — SODIUM CHLORIDE 0.9 % IV SOLN
INTRAVENOUS | Status: DC | PRN
  Administered 2017-06-25: 15 ug/min via INTRAVENOUS

## 2017-06-25 MED ORDER — FENTANYL CITRATE (PF) 100 MCG/2ML IJ SOLN
INTRAMUSCULAR | Status: DC | PRN
  Administered 2017-06-25: 100 ug via INTRAVENOUS

## 2017-06-25 MED ORDER — MIDAZOLAM HCL 2 MG/2ML IJ SOLN: 1.0000 mg | Freq: Once | INTRAMUSCULAR | Status: DC

## 2017-06-25 MED ORDER — BUPIVACAINE HCL (PF) 0.5 % IJ SOLN
INTRAMUSCULAR | Status: DC | PRN
  Administered 2017-06-25: 13 mL via PERINEURAL
  Administered 2017-06-25: 7 mL via PERINEURAL

## 2017-06-25 MED ORDER — CLONAZEPAM 0.5 MG PO TABS: 1.0000 mg | ORAL_TABLET | Freq: Two times a day (BID) | ORAL | Status: DC | PRN

## 2017-06-25 MED ORDER — SODIUM CHLORIDE 0.9 % IJ SOLN
INTRAMUSCULAR | Status: AC
  Filled 2017-06-25: qty 10

## 2017-06-25 MED ORDER — SODIUM CHLORIDE 0.9 % IV SOLN
INTRAVENOUS | Status: DC
  Administered 2017-06-25: 08:00:00 via INTRAVENOUS

## 2017-06-25 MED ORDER — FENTANYL CITRATE (PF) 100 MCG/2ML IJ SOLN
50.0000 ug | Freq: Once | INTRAMUSCULAR | Status: AC
Start: 2017-06-25 — End: 2017-06-25
  Administered 2017-06-25: 50 ug via INTRAVENOUS

## 2017-06-25 MED ORDER — VANCOMYCIN HCL IN DEXTROSE 1-5 GM/200ML-% IV SOLN
1000.0000 mg | INTRAVENOUS | Status: AC
  Administered 2017-06-25: 1000 mg via INTRAVENOUS

## 2017-06-25 MED ORDER — LORATADINE 10 MG PO TABS
10.0000 mg | ORAL_TABLET | Freq: Every day | ORAL | Status: DC
  Administered 2017-06-26 – 2017-06-27 (×2): 10 mg via ORAL
  Filled 2017-06-25 (×2): qty 1

## 2017-06-25 MED ORDER — ACETAMINOPHEN 325 MG PO TABS
650.0000 mg | ORAL_TABLET | ORAL | Status: DC | PRN
  Administered 2017-06-27: 650 mg via ORAL
  Filled 2017-06-25: qty 2

## 2017-06-25 MED ORDER — DOCUSATE SODIUM 100 MG PO CAPS
100.0000 mg | ORAL_CAPSULE | Freq: Two times a day (BID) | ORAL | Status: DC
  Administered 2017-06-25 – 2017-06-27 (×4): 100 mg via ORAL
  Filled 2017-06-25 (×4): qty 1

## 2017-06-25 MED ORDER — PHENYLEPHRINE HCL 10 MG/ML IJ SOLN
INTRAMUSCULAR | Status: DC | PRN
  Administered 2017-06-25: 50 ug via INTRAVENOUS
  Administered 2017-06-25: 150 ug via INTRAVENOUS
  Administered 2017-06-25: 100 ug via INTRAVENOUS

## 2017-06-25 MED ORDER — NEOMYCIN-POLYMYXIN B GU 40-200000 IR SOLN
Status: AC
Start: 2017-06-25 — End: ?
  Filled 2017-06-25: qty 2

## 2017-06-25 MED ORDER — DEXAMETHASONE SODIUM PHOSPHATE 10 MG/ML IJ SOLN
INTRAMUSCULAR | Status: AC
Start: 2017-06-25 — End: ?
  Filled 2017-06-25: qty 1

## 2017-06-25 MED ORDER — PHENOL 1.4 % MT LIQD
1.0000 | OROMUCOSAL | Status: DC | PRN
  Filled 2017-06-25: qty 177

## 2017-06-25 MED ORDER — ONDANSETRON HCL 4 MG PO TABS: 4.0000 mg | ORAL_TABLET | Freq: Three times a day (TID) | ORAL | 0 refills | Status: DC | PRN

## 2017-06-25 MED ORDER — SUGAMMADEX SODIUM 200 MG/2ML IV SOLN
INTRAVENOUS | Status: DC | PRN
  Administered 2017-06-25: 160 mg via INTRAVENOUS

## 2017-06-25 MED ORDER — SODIUM CHLORIDE 0.9 % IV SOLN
INTRAVENOUS | Status: DC
  Administered 2017-06-25 – 2017-06-26 (×2): via INTRAVENOUS

## 2017-06-25 MED ORDER — ONDANSETRON HCL 4 MG/2ML IJ SOLN
INTRAMUSCULAR | Status: AC
  Filled 2017-06-25: qty 2

## 2017-06-25 MED ORDER — FLUTICASONE PROPIONATE 50 MCG/ACT NA SUSP
2.0000 | Freq: Every day | NASAL | Status: DC
  Administered 2017-06-26 – 2017-06-27 (×2): 2 via NASAL
  Filled 2017-06-25: qty 16

## 2017-06-25 MED ORDER — ROCURONIUM BROMIDE 100 MG/10ML IV SOLN
INTRAVENOUS | Status: DC | PRN
  Administered 2017-06-25: 40 mg via INTRAVENOUS
  Administered 2017-06-25: 20 mg via INTRAVENOUS
  Administered 2017-06-25 (×2): 10 mg via INTRAVENOUS

## 2017-06-25 MED ORDER — INSULIN ASPART 100 UNIT/ML ~~LOC~~ SOLN
0.0000 [IU] | Freq: Three times a day (TID) | SUBCUTANEOUS | Status: DC
  Administered 2017-06-26: 3 [IU] via SUBCUTANEOUS
  Administered 2017-06-26: 1 [IU] via SUBCUTANEOUS
  Administered 2017-06-26: 3 [IU] via SUBCUTANEOUS
  Administered 2017-06-27 (×2): 2 [IU] via SUBCUTANEOUS
  Administered 2017-06-27: 5 [IU] via SUBCUTANEOUS
  Filled 2017-06-25 (×6): qty 1

## 2017-06-25 MED ORDER — METFORMIN HCL 500 MG PO TABS
1000.0000 mg | ORAL_TABLET | Freq: Two times a day (BID) | ORAL | Status: DC
  Filled 2017-06-25: qty 2

## 2017-06-25 MED ORDER — MAGNESIUM CITRATE PO SOLN
1.0000 | Freq: Once | ORAL | Status: DC | PRN
  Filled 2017-06-25: qty 296

## 2017-06-25 MED ORDER — CYCLOBENZAPRINE HCL 10 MG PO TABS
5.0000 mg | ORAL_TABLET | Freq: Three times a day (TID) | ORAL | Status: DC | PRN
  Administered 2017-06-26 (×2): 5 mg via ORAL
  Filled 2017-06-25 (×2): qty 1

## 2017-06-25 MED ORDER — BUPIVACAINE LIPOSOME 1.3 % IJ SUSP
INTRAMUSCULAR | Status: AC
  Filled 2017-06-25: qty 20

## 2017-06-25 MED ORDER — ONDANSETRON HCL 4 MG/2ML IJ SOLN
INTRAMUSCULAR | Status: DC | PRN
  Administered 2017-06-25: 4 mg via INTRAVENOUS

## 2017-06-25 MED ORDER — METOCLOPRAMIDE HCL 10 MG PO TABS: 5.0000 mg | ORAL_TABLET | Freq: Three times a day (TID) | ORAL | Status: DC | PRN

## 2017-06-25 MED ORDER — OXYCODONE HCL 5 MG PO TABS
5.0000 mg | ORAL_TABLET | ORAL | Status: DC | PRN
  Administered 2017-06-26 – 2017-06-27 (×4): 5 mg via ORAL
  Filled 2017-06-25 (×5): qty 1

## 2017-06-25 MED ORDER — PROPOFOL 10 MG/ML IV BOLUS
INTRAVENOUS | Status: DC | PRN
  Administered 2017-06-25: 150 mg via INTRAVENOUS

## 2017-06-25 MED ORDER — HYDROMORPHONE HCL 1 MG/ML IJ SOLN: 0.5000 mg | INTRAMUSCULAR | Status: DC | PRN

## 2017-06-25 MED ORDER — OXYCODONE HCL 5 MG PO TABS
5.0000 mg | ORAL_TABLET | Freq: Once | ORAL | Status: AC | PRN
  Administered 2017-06-25: 5 mg via ORAL

## 2017-06-25 MED ORDER — CHLORHEXIDINE GLUCONATE CLOTH 2 % EX PADS: 6.0000 | MEDICATED_PAD | Freq: Once | CUTANEOUS | Status: DC

## 2017-06-25 MED ORDER — ACETAMINOPHEN 10 MG/ML IV SOLN
INTRAVENOUS | Status: DC | PRN
  Administered 2017-06-25: 1000 mg via INTRAVENOUS

## 2017-06-25 MED ORDER — GLIPIZIDE 10 MG PO TABS
10.0000 mg | ORAL_TABLET | Freq: Two times a day (BID) | ORAL | Status: DC
  Filled 2017-06-25: qty 1

## 2017-06-25 MED ORDER — METOCLOPRAMIDE HCL 5 MG/ML IJ SOLN: 5.0000 mg | Freq: Three times a day (TID) | INTRAMUSCULAR | Status: DC | PRN

## 2017-06-25 MED ORDER — SENNA 8.6 MG PO TABS
1.0000 | ORAL_TABLET | Freq: Two times a day (BID) | ORAL | Status: DC
  Administered 2017-06-25 – 2017-06-27 (×4): 8.6 mg via ORAL
  Filled 2017-06-25 (×4): qty 1

## 2017-06-25 MED ORDER — SUGAMMADEX SODIUM 200 MG/2ML IV SOLN
INTRAVENOUS | Status: AC
  Filled 2017-06-25: qty 2

## 2017-06-25 MED ORDER — LIDOCAINE HCL (PF) 1 % IJ SOLN
INTRAMUSCULAR | Status: AC
  Filled 2017-06-25: qty 30

## 2017-06-25 MED ORDER — MENTHOL 3 MG MT LOZG
1.0000 | LOZENGE | OROMUCOSAL | Status: DC | PRN
  Filled 2017-06-25: qty 9

## 2017-06-25 MED ORDER — BUPIVACAINE HCL (PF) 0.25 % IJ SOLN
INTRAMUSCULAR | Status: AC
  Filled 2017-06-25: qty 30

## 2017-06-25 SURGICAL SUPPLY — 75 items
ADAPTER IRRIG TUBE 2 SPIKE SOL (ADAPTER) ×6 IMPLANT
ANCHOR ALL-SUT Q-FIX 2.8 (Anchor) ×3 IMPLANT
BUR RADIUS 4.0X18.5 (BURR) ×3 IMPLANT
BUR RADIUS 5.5 (BURR) ×3 IMPLANT
CANISTER SUCT LVC 12 LTR MEDI- (MISCELLANEOUS) IMPLANT
CANNULA 5.75X7 CRYSTAL CLEAR (CANNULA) ×6 IMPLANT
CANNULA PARTIAL THREAD 2X7 (CANNULA) ×3 IMPLANT
CANNULA TWIST IN 8.25X9CM (CANNULA) IMPLANT
CLOSURE WOUND 1/2 X4 (GAUZE/BANDAGES/DRESSINGS) ×2
CONNECTOR PERFECT PASSER (CONNECTOR) ×3 IMPLANT
COOLER POLAR GLACIER W/PUMP (MISCELLANEOUS) ×3 IMPLANT
CRADLE LAMINECT ARM (MISCELLANEOUS) ×3 IMPLANT
DEVICE SUCT BLK HOLE OR FLOOR (MISCELLANEOUS) ×3 IMPLANT
DRAPE IMP U-DRAPE 54X76 (DRAPES) ×6 IMPLANT
DRAPE INCISE IOBAN 66X45 STRL (DRAPES) ×3 IMPLANT
DRAPE SHEET LG 3/4 BI-LAMINATE (DRAPES) ×3 IMPLANT
DRAPE U-SHAPE 47X51 STRL (DRAPES) IMPLANT
DURAPREP 26ML APPLICATOR (WOUND CARE) ×15 IMPLANT
ELECT REM PT RETURN 9FT ADLT (ELECTROSURGICAL) ×3
ELECTRODE REM PT RTRN 9FT ADLT (ELECTROSURGICAL) ×1 IMPLANT
GAUZE PETRO XEROFOAM 1X8 (MISCELLANEOUS) ×3 IMPLANT
GAUZE SPONGE 4X4 12PLY STRL (GAUZE/BANDAGES/DRESSINGS) ×6 IMPLANT
GLOVE BIO SURGEON STRL SZ 6.5 (GLOVE) ×8 IMPLANT
GLOVE BIO SURGEONS STRL SZ 6.5 (GLOVE) ×4
GLOVE BIOGEL PI IND STRL 7.5 (GLOVE) ×4 IMPLANT
GLOVE BIOGEL PI IND STRL 9 (GLOVE) ×1 IMPLANT
GLOVE BIOGEL PI INDICATOR 7.5 (GLOVE) ×8
GLOVE BIOGEL PI INDICATOR 9 (GLOVE) ×2
GLOVE SURG 9.0 ORTHO LTXF (GLOVE) ×6 IMPLANT
GOWN STRL REUS TWL 2XL XL LVL4 (GOWN DISPOSABLE) ×3 IMPLANT
GOWN STRL REUS W/ TWL LRG LVL3 (GOWN DISPOSABLE) ×3 IMPLANT
GOWN STRL REUS W/ TWL LRG LVL4 (GOWN DISPOSABLE) ×1 IMPLANT
GOWN STRL REUS W/TWL LRG LVL3 (GOWN DISPOSABLE) ×6
GOWN STRL REUS W/TWL LRG LVL4 (GOWN DISPOSABLE) ×2
IV LACTATED RINGER IRRG 3000ML (IV SOLUTION) ×24
IV LR IRRIG 3000ML ARTHROMATIC (IV SOLUTION) ×12 IMPLANT
KIT RM TURNOVER STRD PROC AR (KITS) ×3 IMPLANT
KIT STABILIZATION SHOULDER (MISCELLANEOUS) ×3 IMPLANT
KIT SUTURE 2.8 Q-FIX DISP (MISCELLANEOUS) ×3 IMPLANT
KIT SUTURETAK 3.0 INSERT PERC (KITS) IMPLANT
MANIFOLD NEPTUNE II (INSTRUMENTS) ×3 IMPLANT
MASK FACE SPIDER DISP (MASK) ×3 IMPLANT
MAT BLUE FLOOR 46X72 FLO (MISCELLANEOUS) ×6 IMPLANT
NDL SAFETY ECLIPSE 18X1.5 (NEEDLE) ×1 IMPLANT
NEEDLE HYPO 18GX1.5 SHARP (NEEDLE) ×2
NEEDLE HYPO 22GX1.5 SAFETY (NEEDLE) ×3 IMPLANT
NS IRRIG 500ML POUR BTL (IV SOLUTION) ×3 IMPLANT
PACK ARTHROSCOPY SHOULDER (MISCELLANEOUS) ×3 IMPLANT
PAD WRAPON POLAR SHDR XLG (MISCELLANEOUS) ×1 IMPLANT
PASSER SUT CAPTURE FIRST (SUTURE) ×3 IMPLANT
SET TUBE SUCT SHAVER OUTFL 24K (TUBING) ×3 IMPLANT
SET TUBE TIP INTRA-ARTICULAR (MISCELLANEOUS) ×3 IMPLANT
SLING ULTRA II M (MISCELLANEOUS) ×3 IMPLANT
STRAP SAFETY BODY (MISCELLANEOUS) ×3 IMPLANT
STRIP CLOSURE SKIN 1/2X4 (GAUZE/BANDAGES/DRESSINGS) ×4 IMPLANT
SUT ETHILON 4-0 (SUTURE) ×4
SUT ETHILON 4-0 FS2 18XMFL BLK (SUTURE) ×2
SUT KNTLS 2.8 MAGNUM (Anchor) ×3 IMPLANT
SUT LASSO 90 DEG SD STR (SUTURE) IMPLANT
SUT MNCRL 4-0 (SUTURE) ×2
SUT MNCRL 4-0 27XMFL (SUTURE) ×1
SUT PDS AB 0 CT1 27 (SUTURE) ×3 IMPLANT
SUT PERFECTPASSER WHITE CART (SUTURE) ×3 IMPLANT
SUT VIC AB 0 CT1 36 (SUTURE) ×3 IMPLANT
SUT VIC AB 2-0 CT2 27 (SUTURE) ×3 IMPLANT
SUTURE ETHLN 4-0 FS2 18XMF BLK (SUTURE) ×2 IMPLANT
SUTURE MAGNUM WIRE 2X48 BLK (SUTURE) IMPLANT
SUTURE MNCRL 4-0 27XMF (SUTURE) ×1 IMPLANT
SYR 10ML LL (SYRINGE) ×3 IMPLANT
TAPE MICROFOAM 4IN (TAPE) ×3 IMPLANT
TUBING ARTHRO INFLOW-ONLY STRL (TUBING) ×3 IMPLANT
TUBING CONNECTING 10 (TUBING) ×2 IMPLANT
TUBING CONNECTING 10' (TUBING) ×1
WAND HAND CNTRL MULTIVAC 90 (MISCELLANEOUS) ×3 IMPLANT
WRAPON POLAR PAD SHDR XLG (MISCELLANEOUS) ×3

## 2017-06-25 NOTE — OR Nursing (Signed)
Spoke with Dr. Martha ClanKrasinski , pt. To be admiited observation . Bed requested .

## 2017-06-25 NOTE — H&P (Signed)
The patient has been re-examined, and the chart reviewed, and there have been no interval changes to the documented history and physical.    The risks, benefits, and alternatives have been discussed at length, and the patient is willing to proceed.   

## 2017-06-25 NOTE — Anesthesia Preprocedure Evaluation (Signed)
Anesthesia Evaluation  Patient identified by MRN, date of birth, ID band Patient awake    Reviewed: Allergy & Precautions, H&P , NPO status , Patient's Chart, lab work & pertinent test results  History of Anesthesia Complications Negative for: history of anesthetic complications  Airway Mallampati: III  TM Distance: >3 FB Neck ROM: full    Dental  (+) Chipped, Poor Dentition, Caps   Pulmonary neg shortness of breath, sleep apnea and Continuous Positive Airway Pressure Ventilation ,           Cardiovascular Exercise Tolerance: Good hypertension, (-) angina(-) Past MI and (-) DOE      Neuro/Psych  Headaches, Anxiety negative psych ROS   GI/Hepatic Neg liver ROS, GERD  Medicated and Controlled,  Endo/Other  diabetes  Renal/GU      Musculoskeletal   Abdominal   Peds  Hematology negative hematology ROS (+)   Anesthesia Other Findings Past Medical History: No date: Anxiety and depression No date: Chronic back pain No date: Diabetes mellitus without complication (HCC) No date: GERD (gastroesophageal reflux disease) No date: Hyperlipidemia No date: Hypertension No date: Insomnia No date: Obesity No date: Sleep apnea No date: Trochanteric bursitis  Past Surgical History: No date: BACK SURGERY No date: HEMORROIDECTOMY 1999: LUMBAR DISC SURGERY No date: right shoulder surgery      Comment:  rotator cuff repair  BMI    Body Mass Index:  33.79 kg/m      Reproductive/Obstetrics negative OB ROS                             Anesthesia Physical Anesthesia Plan  ASA: III  Anesthesia Plan: General ETT   Post-op Pain Management: GA combined w/ Regional for post-op pain   Induction: Intravenous  PONV Risk Score and Plan: 3 and Ondansetron, Dexamethasone and Midazolam  Airway Management Planned: Oral ETT  Additional Equipment:   Intra-op Plan:   Post-operative Plan: Extubation in  OR  Informed Consent: I have reviewed the patients History and Physical, chart, labs and discussed the procedure including the risks, benefits and alternatives for the proposed anesthesia with the patient or authorized representative who has indicated his/her understanding and acceptance.   Dental Advisory Given  Plan Discussed with: Anesthesiologist, CRNA and Surgeon  Anesthesia Plan Comments: (Patient consented for risks of anesthesia including but not limited to:  - adverse reactions to medications - damage to teeth, lips or other oral mucosa - sore throat or hoarseness - Damage to heart, brain, lungs or loss of life  Patient voiced understanding.)        Anesthesia Quick Evaluation

## 2017-06-25 NOTE — Anesthesia Post-op Follow-up Note (Signed)
Anesthesia QCDR form completed.        

## 2017-06-25 NOTE — Progress Notes (Signed)
Assisted up to bedside commode , pt. C/o left leg numbness from hip to foot Dr. Nyoka LintKrasinki and anesthesiologist notified

## 2017-06-25 NOTE — Anesthesia Postprocedure Evaluation (Signed)
Anesthesia Post Note  Patient: Saveah Norbeck  Procedure(s) Performed: SHOULDER ARTHROSCOPY WITH OPEN ROTATOR CUFF REPAIR,DISTAL CLAVICLE EXCISION, & SUBACROMINAL DECOMPRESSION (Left Shoulder)  Patient location during evaluation: PACU Anesthesia Type: General Level of consciousness: awake and alert Vital Signs Assessment: post-procedure vital signs reviewed and stable Respiratory status: spontaneous breathing, nonlabored ventilation, respiratory function stable and patient connected to nasal cannula oxygen Cardiovascular status: blood pressure returned to baseline and stable Postop Assessment: no apparent nausea or vomiting Anesthetic complications: no Comments: Patient admitted for observation per Dr. Martha ClanKrasinski          Last Vitals:  Vitals:   06/25/17 1341 06/25/17 1617  BP: (!) 146/71 128/72  Pulse: 84 81  Resp: 16 16  Temp: (!) 36.3 C (!) 36.2 C  SpO2: 94% 95%    Last Pain:  Vitals:   06/25/17 1617  TempSrc: Temporal  PainSc: 3                  Cleda MccreedyJoseph K Piscitello

## 2017-06-25 NOTE — Anesthesia Procedure Notes (Signed)
Procedure Name: Intubation Date/Time: 06/25/2017 9:02 AM Performed by: Jonna Clark, CRNA Pre-anesthesia Checklist: Patient identified, Patient being monitored, Timeout performed, Emergency Drugs available and Suction available Patient Re-evaluated:Patient Re-evaluated prior to induction Oxygen Delivery Method: Circle system utilized Preoxygenation: Pre-oxygenation with 100% oxygen Induction Type: IV induction Ventilation: Mask ventilation without difficulty Laryngoscope Size: Mac and 3 Grade View: Grade I Tube type: Oral Tube size: 7.0 mm Number of attempts: 1 Placement Confirmation: ETT inserted through vocal cords under direct vision,  positive ETCO2 and breath sounds checked- equal and bilateral Secured at: 21 cm Tube secured with: Tape Dental Injury: Teeth and Oropharynx as per pre-operative assessment

## 2017-06-25 NOTE — Progress Notes (Signed)
Patient is admitted to room 148 from surgery via recliner. Assist x 2 to get to bed. Patient is alert, but sleepy. Unable to hold open her eyes. Daughter at bedside. Immobilizer in place. Bulky dressing to right shoulder. Bed alarm on for safety. Blood sugar 320, patient not feeling like she could take PO meds at this time. Notified MD. New orders for a hospitalitis consult.

## 2017-06-25 NOTE — Progress Notes (Signed)
Patient asleep, easily aroused when name called. Few Ice chips given.

## 2017-06-25 NOTE — Pre-Procedure Instructions (Signed)
ISB

## 2017-06-25 NOTE — Progress Notes (Signed)
Patient having some nausea, phenergan given As ordered.

## 2017-06-25 NOTE — Anesthesia Procedure Notes (Addendum)
Anesthesia Regional Block: Interscalene brachial plexus block   Pre-Anesthetic Checklist: ,, timeout performed, Correct Patient, Correct Site, Correct Laterality, Correct Procedure, Correct Position, site marked, Risks and benefits discussed,  Surgical consent,  Pre-op evaluation,  At surgeon's request and post-op pain management  Laterality: Upper and Left  Prep: chloraprep       Needles:  Injection technique: Single-shot  Needle Type: Stimiplex     Needle Length: 5cm  Needle Gauge: 22     Additional Needles:   Narrative:  Start time: 06/25/2017 8:29 AM End time: 06/25/2017 8:31 AM Injection made incrementally with aspirations every 5 mL.  Performed by: Personally  Anesthesiologist: Wadsworth Skolnick, Cleda MccreedyJoseph K, MD  Additional Notes: Functioning IV was confirmed and monitors were applied.  A 50mm 22ga Stimuplex needle was used. Sterile prep,hand hygiene and sterile gloves were used.  Minimal sedation used for procedure.  No paresthesia endorsed by patient during the procedure.  Negative aspiration and negative test dose prior to incremental administration of local anesthetic. The patient tolerated the procedure well with no immediate complications.

## 2017-06-25 NOTE — Transfer of Care (Signed)
Immediate Anesthesia Transfer of Care Note  Patient: Courtney [name]  Procedure(s) Performed: SHOULDER ARTHROSCOPY WITH OPEN ROTATOR CUFF REPAIR,DISTAL CLAVICLE EXCISION, & SUBACROMINAL DECOMPRESSION (Left Shoulder)  Patient Location: PACU  Anesthesia Type:General  Level of Consciousness: drowsy and patient cooperative  Airway & Oxygen Therapy: Patient Spontanous Breathing and Patient connected to face mask oxygen  Post-op Assessment: Report given to RN and Post -op Vital signs reviewed and stable  Post vital signs: Reviewed and stable  Last Vitals:  Vitals:   06/25/17 0835 06/25/17 1158  BP: 103/62 95/60  Pulse: 73 73  Resp: 14 12  Temp:  (!) 36.1 C  SpO2: 98% 100%    Last Pain:  Vitals:   06/25/17 0731  TempSrc: Tympanic  PainSc: 10-Worst pain ever         Complications: No apparent anesthesia complications

## 2017-06-25 NOTE — Progress Notes (Signed)
  Subjective:  POST OP CHECK:  Patient had left shoulder rotator cuff repair today.  She has diminished sensation in left foot and weakness of toe extension and dorsiflexion.  Patient reports no pain in left shoulder.  She had a block with Exparel today by anesthesia and this seems to be working well.   Patient has a sore throat and headache.  Her blood sugars were over 300 post-op.   Appreciate hospitalist consult to help with diabetes management.   Objective:   VITALS:   Vitals:   06/25/17 1341 06/25/17 1617 06/25/17 1655 06/25/17 1814  BP: (!) 146/71 128/72 135/67 (!) 145/72  Pulse: 84 81 77 83  Resp: 16 16 16 18   Temp: (!) 97.4 F (36.3 C) (!) 97.2 F (36.2 C) (!) 97.5 F (36.4 C) 98 F (36.7 C)  TempSrc:  Temporal Oral Oral  SpO2: 94% 95% 94% 92%  Weight:   80.1 kg (176 lb 8 oz)   Height:   5' (1.524 m)     PHYSICAL EXAM: Left upper extremity:  Dressing C/D/I.  Abduction sling in place.   Patient can't move her left fingers or feel her fingers due to her block.  Fingers are well perfused.    Left lower extremity:  Patient with paresthesias of the left toes.  Weakness present of left toe extension and dorsiflexion of the ankle.     LABS  Results for orders placed or performed during the hospital encounter of 06/25/17 (from the past 24 hour(s))  Glucose, capillary     Status: Abnormal   Collection Time: 06/25/17  7:32 AM  Result Value Ref Range   Glucose-Capillary 326 (H) 65 - 99 mg/dL  Glucose, capillary     Status: Abnormal   Collection Time: 06/25/17  8:37 AM  Result Value Ref Range   Glucose-Capillary 322 (H) 65 - 99 mg/dL  Glucose, capillary     Status: Abnormal   Collection Time: 06/25/17  9:59 AM  Result Value Ref Range   Glucose-Capillary 240 (H) 65 - 99 mg/dL  Glucose, capillary     Status: Abnormal   Collection Time: 06/25/17 12:06 PM  Result Value Ref Range   Glucose-Capillary 264 (H) 65 - 99 mg/dL   Comment 1 Notify RN    Comment 2 Call MD NNP PA CNM    Glucose, capillary     Status: Abnormal   Collection Time: 06/25/17  1:34 PM  Result Value Ref Range   Glucose-Capillary 280 (H) 65 - 99 mg/dL  Glucose, capillary     Status: Abnormal   Collection Time: 06/25/17  5:03 PM  Result Value Ref Range   Glucose-Capillary 320 (H) 65 - 99 mg/dL    No results found.  Assessment/Plan: Day of Surgery   Active Problems:   S/P left shoulder rotator cuff surgery Post-op hyperglycemia Left post-op drop foot  Patient will have 24 hours of post-op antibiotics.   Appreciate hospitalist management of hyperglycemia.   PT and OT tomorrow AM.   Will watch for resolution of foot drop on the left which is likely due to positioning during surgery.  Nerve block working well.       Juanell FairlyKRASINSKI, Tobey Schmelzle , MD 06/25/2017, 6:33 PM

## 2017-06-25 NOTE — Progress Notes (Signed)
Blood sugar 411. Md notified.

## 2017-06-25 NOTE — Op Note (Signed)
06/25/2017  12:03 PM  PATIENT:  Courtney [name]  62 y.o. female  PRE-OPERATIVE DIAGNOSIS:  Left shoulder impingement with possible partial rotator cuff tear  POST-OPERATIVE DIAGNOSIS:  Left shoulder impingement with partial  partial rotator cuff tear involving the supraspinatus   PROCEDURE:  Procedure(s): LEFT SHOULDER ARTHROSCOPY WITH OPEN ROTATOR CUFF REPAIR,DISTAL CLAVICLE EXCISION, & SUBACROMINAL DECOMPRESSION   SURGEON:  Surgeon(s) and Role:    Thornton Park, MD - Primary  ANESTHESIA:   local, general and paracervical block   PREOPERATIVE INDICATIONS:  Courtney [name] is a  62 y.o. female with a diagnosis of left shoulder impingement with possible partial rotator cuff tear failed conservative treatment and elected for surgical management.    The risks benefits and alternatives were discussed with the patient preoperatively including but not limited to the risks of infection, bleeding, nerve injury, persistent pain or weakness, failure of the hardware, re-tear of the rotator cuff and the need for further surgery. Medical risks include DVT and pulmonary embolism, myocardial infarction, stroke, pneumonia, respiratory failure and death. Patient understood these risks and wished to proceed.  OPERATIVE IMPLANTS: ArthroCare Magnum 2 anchors x 1 & Smith and Nephew Q Fix anchors x 1  OPERATIVE FINDINGS: Left shoulder subacromial bursitis, impingement, acromioclavicular arthritis and high grade partial tear of the supraspinatus  OPERATIVE PROCEDURE: The patient was met in the preoperative area. The left shoulder was signed with the word yes and my initials according the hospital's correct site of surgery protocol.   History and physical was updated.  Patient underwent an interscalene block with Exparel by the anesthesia service.  Patient was brought to the operating room where she underwent general anesthesia.  The patient was placed in a beachchair position.  A spider arm positioner was used for  this case. Examination under anesthesia revealed no limitation of motion or instability with load shift testing. The patient had a negative sulcus sign.  Patient was prepped and draped in a sterile fashion. A timeout was performed to verify the patient's name, date of birth, medical record number, correct site of surgery and correct procedure to be performed there was also used to verify the patient received antibiotics that all appropriate instruments, implants and radiographs studies were available in the room. Once all in attendance were in agreement case began.  Bony landmarks were drawn out with a surgical marker along with proposed arthroscopy incisions. These were pre-injected with 1% lidocaine plain. An 11 blade was used to establish a posterior portal through which the arthroscope was placed in the glenohumeral joint. A full diagnostic examination of the shoulder was performed.  Patient was found to have high grade partial thickness tear of the anterior supraspinatus. An 18-gauge spinal needle was used to place a  0 PDS suture through the rotator cuff tear for identification from the bursal side.  The arthroscope was then placed in the subacromial space.   Extensive bursitis was encountered and debrided using a 4-0 resector shaver blade and a 90 ArthroCare wand from a lateral portal which was established under direct visualization using an 18-gauge spinal needle. The 0 PDS suture was identified. A subacromial decompression was also performed using a 5.5 mm resector shaver blade from the lateral portal. The partial-thickness tear was then completed with a 4.0 resector shaver blade.  A single Perfect Pass suture was placed in the lateral border of the rotator cuff tear. All arthroscopic instruments were then removed and the mini-open portion of the procedure began.  A saber-type incision was  made along the lateral border of the acromion. The deltoid muscle was identified and split in line with its  fibers which allowed visualization of the rotator cuff. The Perfect Pass suture previously placed in the lateral border of the rotator cuff was brought out through the deltoid split.  A 5.5 mm resector shaver blade was then used to debride the greater tuberosity of all torn fibers of the rotator cuff.  A single [employer] anchor was placed at the articular margin of the humeral head with the greater tuberosity. The suture limbs of the Q Fix anchor were passed medially through the rotator cuff using a First Pass suture passer.   The Perfect Pass suture was then anchored to the greater tuberosity footprint using a single Magnum 2 anchor. Theis anchor was then tensioned to allow for anatomic reduction of the rotator cuff to the greater tuberosity. The medial row sutures were then tied down using an arthroscopic knot tying technique.  Arthroscopic images of the repair were taken with the arthroscope both externally and from inside the glenohumeral joint.  All incisions were copiously irrigated. The deltoid fascia was repaired using a 0 Vicryl suture.  The subcutaneous tissue of all incisions were closed with a 2-0 Vicryl. Skin closure for the arthroscopic incisions was performed with 4-0 nylon. The skin edges of the saber incision was approximated with a running 4-0 undyed Monocryl.  0.25% Marcaine plain was then injected into the subacromial space for postoperative pain control. A dry sterile dressing was applied.  The patient was placed in an abduction sling and a Polar Care was applied to the shoulder.  All sharp and it instrument counts were correct at the conclusion of the case. I was scrubbed and present for the entire case. I spoke with the patient's daughter postoperatively to let her know the case had been performed without complication and the patient was stable in recovery room.

## 2017-06-25 NOTE — Discharge Instructions (Signed)

## 2017-06-25 NOTE — Consult Note (Signed)
Reason for Consult: No chief complaint on file.  Referring Physician:  Juanell FairlyKrasinski, Kevin, MD  Courtney [name] is an 62 y.o. female.   Reason for consult medical management including diabetes HPI: Courtney [name] is a 62 year old female admitted to orthopedic service for left shoulder impingement with possible partial rotator cuff tear.  Patient had surgery today.  Postoperatively patient's blood sugars were elevated to mid 300s.  Hospitalist team is consulted regarding medical management including diabetes mellitus.  Patient is reporting left shoulder pain and headache during my examination.  Also reporting sore throat.  Reporting nausea but tolerating diet.  No other complaints  Past Medical History:  Diagnosis Date  . Anxiety and depression   . Chronic back pain   . Diabetes mellitus without complication (HCC)   . GERD (gastroesophageal reflux disease)   . Hyperlipidemia   . Hypertension   . Insomnia   . Obesity   . Sleep apnea   . Trochanteric bursitis     Past Surgical History:  Procedure Laterality Date  . BACK SURGERY    . HEMORROIDECTOMY    . LUMBAR DISC SURGERY  1999  . right shoulder surgery      rotator cuff repair    Family History  Problem Relation Age of Onset  . Diabetes Father     Social History:  reports that  has never smoked. she has never used smokeless tobacco. She reports that she does not drink alcohol or use drugs.  Allergies:  Allergies  Allergen Reactions  . Tizanidine Other (See Comments)    Dizzy and chest pain  . Ace Inhibitors Cough  . Codeine Itching    With Tylenol   . Lidoderm [Lidocaine] Itching    Patch   . Naproxen Itching  . Niacin And Related     nausea   . Penicillins Itching    Has patient had a PCN reaction causing immediate rash, facial/tongue/throat swelling, SOB or lightheadedness with hypotension: no Has patient had a PCN reaction causing severe rash involving mucus membranes or skin necrosis: no Has patient had a PCN  reaction that required hospitalization: no Has patient had a PCN reaction occurring within the last 10 years: no If all of the above answers are "NO", then may proceed with Cephalosporin use.     . Pravastatin Other (See Comments)    Fatigue, headache   . Prednisone Itching  . Simvastatin Itching  . Tramadol Other (See Comments)    Chest pain    Medications: I have reviewed the patient's current medications.  Results for orders placed or performed during the hospital encounter of 06/25/17 (from the past 48 hour(s))  Glucose, capillary     Status: Abnormal   Collection Time: 06/25/17  7:32 AM  Result Value Ref Range   Glucose-Capillary 326 (H) 65 - 99 mg/dL  Glucose, capillary     Status: Abnormal   Collection Time: 06/25/17  8:37 AM  Result Value Ref Range   Glucose-Capillary 322 (H) 65 - 99 mg/dL  Glucose, capillary     Status: Abnormal   Collection Time: 06/25/17  9:59 AM  Result Value Ref Range   Glucose-Capillary 240 (H) 65 - 99 mg/dL  Glucose, capillary     Status: Abnormal   Collection Time: 06/25/17 12:06 PM  Result Value Ref Range   Glucose-Capillary 264 (H) 65 - 99 mg/dL   Comment 1 Notify RN    Comment 2 Call MD NNP PA CNM   Glucose, capillary  Status: Abnormal   Collection Time: 06/25/17  1:34 PM  Result Value Ref Range   Glucose-Capillary 280 (H) 65 - 99 mg/dL  Glucose, capillary     Status: Abnormal   Collection Time: 06/25/17  5:03 PM  Result Value Ref Range   Glucose-Capillary 320 (H) 65 - 99 mg/dL    No results found.  ROS:  CONSTITUTIONAL: Denies fevers, chills. Denies any fatigue, weakness.  Reporting headache EYES: Denies blurry vision, double vision, eye pain. EARS, NOSE, THROAT: Denies tinnitus, ear pain, hearing loss. RESPIRATORY: Denies cough, wheeze, shortness of breath.  CARDIOVASCULAR: Denies chest pain, palpitations, edema.  GASTROINTESTINAL: Denies nausea, vomiting, diarrhea, abdominal pain. Denies bright red blood per  rectum. GENITOURINARY: Denies dysuria, hematuria. ENDOCRINE: Denies nocturia or thyroid problems. HEMATOLOGIC AND LYMPHATIC: Denies easy bruising or bleeding. SKIN: Denies rash or lesion. MUSCULOSKELETAL: Denies pain in neck, back, shoulder, knees, hips or arthritic symptoms.  NEUROLOGIC: Denies paralysis, paresthesias.  PSYCHIATRIC: Denies anxiety or depressive symptoms. Blood pressure 135/67, pulse 77, temperature (!) 97.5 F (36.4 C), temperature source Oral, resp. rate 16, height 5' (1.524 m), weight 80.1 kg (176 lb 8 oz), SpO2 94 %.   PHYSICAL EXAMINATION:  GENERAL: Well-nourished, well-developed  currently in no acute distress.  HEAD: Normocephalic, atraumatic.  EYES: Pupils equal, round, and reactive to light. Extraocular muscles intact. No scleral icterus.  MOUTH: Moist mucosal membranes. Dentition intact. No abscess noted. EARS, NOSE, THROAT: Clear without exudates. No external lesions.  NECK: Supple. No thyromegaly. No nodules. No JVD.  PULMONARY: Clear to auscultation bilaterally without wheezes, rales, or rhonchi. No use of accessory muscles. Good respiratory effort. CHEST: Nontender to palpation.  CARDIOVASCULAR: S1, S2, regular rate and rhythm. No murmurs, rubs, or gallops.  GASTROINTESTINAL: Soft, nontender, nondistended. No masses. Positive bowel sounds. No hepatosplenomegaly. MUSCULOSKELETAL: No swelling, clubbing, edema. Range of motion full in all extremities except left upper extremity status post repair of the left shoulder NEUROLOGIC: Cranial nerves II-XII intact. No gross focal neurological deficits. Sensation intact. Reflexes intact. SKIN: No ulcerations, lesions, rash, cyanosis. Skin warm, dry. Turgor intact. PSYCHIATRIC: Mood, affect within normal limits. Patient awake, alert, oriented x 3. Insight and judgment intact.   Assessment/Plan: 62 year old female with left shoulder impingement possible rotator cuff tear had a left shoulder arthroscopy with open  rotator cuff repair, hospitalist team is consulted for medical management including diabetes mellitus with hyperglycemia.  #diabetes mellitus with hyperglycemia Provide sliding scale insulin Hold metformin Continue glipizide Check hemoglobin A1c  #Headache provide Tylenol  #Essential hypertension Blood pressure is stable continue home medication hydrochlorothiazide and Avapro   #Sore throat  provide her Cepacol lozenges as needed  #Left rotator cuff surgery management by orthopedics  Thank you for consulting hospitalist team for medical management  TOTAL TIME TAKING CARE OF THIS PATIENT: 42 minutes.   Note: This dictation was prepared with Dragon dictation along with smaller phrase technology. Any transcriptional errors that result from this process are unintentional.   @[facility] @ Pager - [phone] 06/25/2017, 5:53 PM

## 2017-06-26 ENCOUNTER — Encounter: Payer: Self-pay | Admitting: Orthopedic Surgery

## 2017-06-26 DIAGNOSIS — M25812 Other specified joint disorders, left shoulder: Secondary | ICD-10-CM | POA: Diagnosis not present

## 2017-06-26 LAB — GLUCOSE, CAPILLARY
GLUCOSE-CAPILLARY: 317 mg/dL — AB (ref 65–99)
Glucose-Capillary: 148 mg/dL — ABNORMAL HIGH (ref 65–99)
Glucose-Capillary: 212 mg/dL — ABNORMAL HIGH (ref 65–99)
Glucose-Capillary: 230 mg/dL — ABNORMAL HIGH (ref 65–99)

## 2017-06-26 LAB — CBC
HCT: 32.6 % — ABNORMAL LOW (ref 35.0–47.0)
HEMOGLOBIN: 10.5 g/dL — AB (ref 12.0–16.0)
MCH: 28.6 pg (ref 26.0–34.0)
MCHC: 32.3 g/dL (ref 32.0–36.0)
MCV: 88.4 fL (ref 80.0–100.0)
PLATELETS: 325 10*3/uL (ref 150–440)
RBC: 3.69 MIL/uL — AB (ref 3.80–5.20)
RDW: 16.2 % — ABNORMAL HIGH (ref 11.5–14.5)
WBC: 15.5 10*3/uL — ABNORMAL HIGH (ref 3.6–11.0)

## 2017-06-26 LAB — HEMOGLOBIN A1C
HEMOGLOBIN A1C: 9.6 % — AB (ref 4.8–5.6)
MEAN PLASMA GLUCOSE: 228.82 mg/dL

## 2017-06-26 LAB — BASIC METABOLIC PANEL
Anion gap: 8 (ref 5–15)
BUN: 13 mg/dL (ref 6–20)
CALCIUM: 8 mg/dL — AB (ref 8.9–10.3)
CHLORIDE: 106 mmol/L (ref 101–111)
CO2: 25 mmol/L (ref 22–32)
CREATININE: 0.68 mg/dL (ref 0.44–1.00)
GFR calc Af Amer: >60 mL/min (ref >60)
GLUCOSE: 135 mg/dL — AB (ref 65–99)
POTASSIUM: 3.1 mmol/L — AB (ref 3.5–5.1)
Sodium: 139 mmol/L (ref 135–145)

## 2017-06-26 MED ORDER — METFORMIN HCL 500 MG PO TABS
500.0000 mg | ORAL_TABLET | Freq: Two times a day (BID) | ORAL | Status: DC
  Administered 2017-06-26 – 2017-06-27 (×3): 500 mg via ORAL
  Filled 2017-06-26 (×3): qty 1

## 2017-06-26 NOTE — Progress Notes (Signed)
Subjective:  POD #1 s/p right rotator cuff repair.  Patient reports left shoulder pain as mild.  She still complains of a headache.  Patient continues to have paresthesias and weakness in the left leg but this is improving. Patient OOB to chair.  Objective:   VITALS:   Vitals:   06/26/17 0409 06/26/17 0506 06/26/17 0832 06/26/17 1021  BP: (!) 166/79 (!) 115/59 117/67 (!) 124/59  Pulse: 90 72 75 80  Resp: 17  16   Temp: 98.6 F (37 C)  98.5 F (36.9 C)   TempSrc: Oral  Oral   SpO2: 96%  96%   Weight:      Height:        PHYSICAL EXAM: Left shoulder:  Block beginning to wear off.  She is regaining digital motion.   Still paresthesias in the left hand.  Bandage C/D/I/  Sling in place.    Left lower extremity:  Patient beginning to extend toes and dorsiflex ankle.   Decreased sensation to light touch in left foot.  Pedal pulses are palpable.   LABS  Results for orders placed or performed during the hospital encounter of 06/25/17 (from the past 24 hour(s))  Glucose, capillary     Status: Abnormal   Collection Time: 06/25/17 12:06 PM  Result Value Ref Range   Glucose-Capillary 264 (H) 65 - 99 mg/dL   Comment 1 Notify RN    Comment 2 Call MD NNP PA CNM   Glucose, capillary     Status: Abnormal   Collection Time: 06/25/17  1:34 PM  Result Value Ref Range   Glucose-Capillary 280 (H) 65 - 99 mg/dL  Glucose, capillary     Status: Abnormal   Collection Time: 06/25/17  5:03 PM  Result Value Ref Range   Glucose-Capillary 320 (H) 65 - 99 mg/dL  Glucose, capillary     Status: Abnormal   Collection Time: 06/25/17  9:05 PM  Result Value Ref Range   Glucose-Capillary 411 (H) 65 - 99 mg/dL   Comment 1 Notify RN   CBC     Status: Abnormal   Collection Time: 06/26/17  4:29 AM  Result Value Ref Range   WBC 15.5 (H) 3.6 - 11.0 K/uL   RBC 3.69 (L) 3.80 - 5.20 MIL/uL   Hemoglobin 10.5 (L) 12.0 - 16.0 g/dL   HCT 16.132.6 (L) 09.635.0 - 04.547.0 %   MCV 88.4 80.0 - 100.0 fL   MCH 28.6 26.0 - 34.0  pg   MCHC 32.3 32.0 - 36.0 g/dL   RDW 40.916.2 (H) 81.111.5 - 91.414.5 %   Platelets 325 150 - 440 K/uL  Basic metabolic panel     Status: Abnormal   Collection Time: 06/26/17  4:29 AM  Result Value Ref Range   Sodium 139 135 - 145 mmol/L   Potassium 3.1 (L) 3.5 - 5.1 mmol/L   Chloride 106 101 - 111 mmol/L   CO2 25 22 - 32 mmol/L   Glucose, Bld 135 (H) 65 - 99 mg/dL   BUN 13 6 - 20 mg/dL   Creatinine, Ser 7.820.68 0.44 - 1.00 mg/dL   Calcium 8.0 (L) 8.9 - 10.3 mg/dL   GFR calc non Af Amer >60 >60 mL/min   GFR calc Af Amer >60 >60 mL/min   Anion gap 8 5 - 15  Glucose, capillary     Status: Abnormal   Collection Time: 06/26/17  7:46 AM  Result Value Ref Range   Glucose-Capillary 148 (H) 65 - 99 mg/dL  Comment 1 Notify RN     No results found.  Assessment/Plan: 1 Day Post-Op   Active Problems:   S/P rotator cuff surgery  Continue PT/OT.   Tylenol or ibuprofen for headache.   Oral steroid for lower extremity nerve palsy.  Will continue to monitor progress with PT.    Juanell Fairly , MD 06/26/2017, 11:30 AM

## 2017-06-26 NOTE — Progress Notes (Signed)
[facility] - [place] at [facility]   PATIENT NAME: Courtney [name]    MR#:  [id number]  DATE OF BIRTH:  Nov 23, 1955  SUBJECTIVE: Medical consult follow-up for diabetes management.  Glucose 148 today morning.  Patient complains of severe pain left shoulder.  Still complains of pins and needles sensation in the left leg.  She says symptoms started after her operation and they were not there before  CHIEF COMPLAINT:  No chief complaint on file.   REVIEW OF SYSTEMS:    Review of Systems  Constitutional: Negative for chills and fever.  HENT: Negative for hearing loss.   Eyes: Negative for blurred vision, double vision and photophobia.  Respiratory: Negative for cough, hemoptysis and shortness of breath.   Cardiovascular: Negative for palpitations, orthopnea and leg swelling.  Gastrointestinal: Negative for abdominal pain, diarrhea and vomiting.  Genitourinary: Negative for dysuria and urgency.  Musculoskeletal: Positive for joint pain. Negative for myalgias and neck pain.  Skin: Negative for rash.  Neurological: Positive for sensory change. Negative for dizziness, focal weakness, seizures, weakness and headaches.  Psychiatric/Behavioral: Negative for memory loss. The patient does not have insomnia.     Nutrition:  Tolerating Diet: Tolerating PT:      DRUG ALLERGIES:   Allergies  Allergen Reactions  . Tizanidine Other (See Comments)    Dizzy and chest pain  . Ace Inhibitors Cough  . Codeine Itching    With Tylenol   . Lidoderm [Lidocaine] Itching    Patch   . Naproxen Itching  . Niacin And Related     nausea   . Penicillins Itching    Has patient had a PCN reaction causing immediate rash, facial/tongue/throat swelling, SOB or lightheadedness with hypotension: no Has patient had a PCN reaction causing severe rash involving mucus membranes or skin necrosis: no Has patient had a PCN reaction that required hospitalization: no Has patient had a PCN  reaction occurring within the last 10 years: no If all of the above answers are "NO", then may proceed with Cephalosporin use.     . Pravastatin Other (See Comments)    Fatigue, headache   . Prednisone Itching  . Simvastatin Itching  . Tramadol Other (See Comments)    Chest pain    VITALS:  Blood pressure 117/67, pulse 75, temperature 98.5 F (36.9 C), temperature source Oral, resp. rate 16, height 5' (1.524 m), weight 80.1 kg (176 lb 8 oz), SpO2 96 %.  PHYSICAL EXAMINATION:   Physical Exam  GENERAL:  62 y.o.-year-old patient lying in the bed with no acute distress.  EYES: Pupils equal, round, reactive to light  No scleral icterus. Extraocular muscles intact.  HEENT: Head atraumatic, normocephalic. Oropharynx and nasopharynx clear.  NECK:  Supple, no jugular venous distention. No thyroid enlargement, no tenderness.  LUNGS: Normal breath sounds bilaterally, no wheezing, rales,rhonchi or crepitation. No use of accessory muscles of respiration.  CARDIOVASCULAR: S1, S2 normal. No murmurs, rubs, or gallops.  ABDOMEN: Soft, nontender, nondistended. Bowel sounds present. No organomegaly or mass.  EXTREMITIES: No pedal edema, cyanosis, or clubbing.  Left shoulder dressing, immobilizer present getting ice pack.  NEUROLOGIC: Cranial nerves II through XII are intact. . dncreased sensations in the left leg.. Gait not checked.  PSYCHIATRIC: The patient is alert and oriented x 3.  SKIN: No obvious rash, lesion, or ulcer.    LABORATORY PANEL:   CBC Recent Labs  Lab 06/26/17 0429  WBC 15.5*  HGB 10.5*  HCT 32.6*  PLT 325   ------------------------------------------------------------------------------------------------------------------  Chemistries  Recent Labs  Lab 06/26/17 0429  NA 139  K 3.1*  CL 106  CO2 25  GLUCOSE 135*  BUN 13  CREATININE 0.68  CALCIUM 8.0*    ------------------------------------------------------------------------------------------------------------------  Cardiac Enzymes No results for input(s): TROPONINI in the last 168 hours. ------------------------------------------------------------------------------------------------------------------  RADIOLOGY:  No results found.   ASSESSMENT AND PLAN:   Active Problems:   S/P rotator cuff surgery  Left rotator cuff repair: Has shoulder immobilizer, bulky dressing.  Continue pain medicines, physical therapy as per the recommendation. 2.  Diabetes mellitus type 2: Uncontrolled postoperatively, ,now controlled..  Right now blood sugars controlled with sliding scale with coverage, restart metformin, continue Glucotrol 10 mg twice daily that she takes. #3 history of sleep apnea: Continue CPAP. 4.  Postop foot drop on the left foot: Physical therapy consult today, patient complains of pins and needles sensation in the left leg.  Likely has underlying diabetic neuropathy as well. 5 anxiety/depression: Continue Klonopin 6.  Hyperlipidemia: Continue statins 7.  Sore throat: Patient on Cepacol lozenges.   All the records are reviewed and case discussed with Care Management/Social Workerr. Management plans discussed with the patient, family and they are in agreement.  CODE STATUS:full  TOTAL TIME TAKING CARE OF THIS PATIENT: 35 minutes.   POSSIBLE D/C IN 1-2DAYS, DEPENDING ON CLINICAL CONDITION.   Katha Hamming M.D on 06/26/2017 at 8:55 AM  Between 7am to 6pm - Pager - [phone]  After 6pm go to [email or web address] - password [email or web address] [facility]  [facility]  Office  [phone]  CC: Primary care physician; Shepherd-Banigan, Glenice Laine, MD

## 2017-06-26 NOTE — Evaluation (Signed)
Physical Therapy Evaluation Patient Details Name: Courtney [name] MRN: [id number] DOB: 1956-04-14 Today's Date: 06/26/2017   History of Present Illness  Pt admitted for L rotator cuff Sx on 06/25/17. Post surgery stay complicated by elevated glucose and post op L LE weakness. History includes anxiety, depression, GERD, HTN, and DM.   Clinical Impression  Pt is a pleasant 62 year old female who was admitted for L rotator cuff Sx. Pt performs bed mobility with mod assist +2, transfers with min assist +2, and ambulation with +2 and min assist and HHA on R UE only. Pt demonstrates deficits with strength/mobility/pain. Pt still reports decreased sensation from L thigh down to distal toes and has weakness in dorsiflexion on L foot. Buckling noted in L LE preventing further ambulation than chair. Needs +2 assist for all mobility. Would benefit from skilled PT to address above deficits and promote optimal return to PLOF; recommend transition to STR upon discharge from acute hospitalization.       Follow Up Recommendations SNF    Equipment Recommendations  (TBD)    Recommendations for Other Services       Precautions / Restrictions Precautions Precautions: Fall;Shoulder Shoulder Interventions: Shoulder abduction pillow;At all times Precaution Booklet Issued: Yes (comment) Restrictions Weight Bearing Restrictions: Yes LUE Weight Bearing: Non weight bearing      Mobility  Bed Mobility Overal bed mobility: Needs Assistance Bed Mobility: Supine to Sit     Supine to sit: Mod assist;+2 for safety/equipment     General bed mobility comments: assist for trunk control and scooting out towards EOB. Complained of dizziness during transition  Transfers Overall transfer level: Needs assistance Equipment used: 2 person hand held assist Transfers: Sit to/from Stand Sit to Stand: Min assist         General transfer comment: needs assist for upright posture and standing. Able to HHA on R side  with +2 guarding for safety. Slight buckling noted on  L LE.  Ambulation/Gait Ambulation/Gait assistance: Min assist;+2 physical assistance Ambulation Distance (Feet): 2 Feet Assistive device: 2 person hand held assist Gait Pattern/deviations: Step-to pattern     General Gait Details: decreased weight shift towards L LE with buckling noted. Takes small steps towards chair, however unable to further ambulate at this time  Information systems managertairs            Wheelchair Mobility    Modified Rankin (Stroke Patients Only)       Balance Overall balance assessment: Needs assistance Sitting-balance support: Feet supported Sitting balance-Leahy Scale: Good     Standing balance support: Single extremity supported Standing balance-Leahy Scale: Fair                               Pertinent Vitals/Pain Pain Assessment: Faces Faces Pain Scale: Hurts even more Pain Location: L UE/L LE (knee) Pain Descriptors / Indicators: Discomfort;Dull Pain Intervention(s): Limited activity within patient's tolerance;Ice applied    Home Living Family/patient expects to be discharged to:: Private residence Living Arrangements: Alone Available Help at Discharge: Family(has son nearby who can assist as needed) Type of Home: House Home Access: Stairs to enter Entrance Stairs-Rails: (grab rail) Secretary/administratorntrance Stairs-Number of Steps: 1 Home Layout: One level Home Equipment: Cane - single point      Prior Function Level of Independence: Independent         Comments: not active at home secondary to hip pain     Hand Dominance  Extremity/Trunk Assessment   Upper Extremity Assessment Upper Extremity Assessment: LUE deficits/detail LUE Deficits / Details: grip strength 2/5; wrist flexion 3/5; reports numbness persisting    Lower Extremity Assessment Lower Extremity Assessment: Generalized weakness(L LE grossly 3/5; dorsiflexion 2/5; R LE grossly 3+/5)       Communication    Communication: No difficulties(hoarse from throat)  Cognition Arousal/Alertness: Awake/alert Behavior During Therapy: WFL for tasks assessed/performed Overall Cognitive Status: Within Functional Limits for tasks assessed                                        General Comments      Exercises Other Exercises Other Exercises: Seated ther-ex performed on L UE including grip squeezes, wrist flexion/extension and elbow flexion. All ther-ex performed x 5 reps with min assist. Supine L LE ther-ex performed including ankle pumps, quad sets, SLRs, and hip abd/add. All ther-ex performed x 10 reps with min/mod assist for technique   Assessment/Plan    PT Assessment Patient needs continued PT services  PT Problem List Decreased strength;Decreased activity tolerance;Decreased balance;Decreased mobility;Pain;Impaired sensation       PT Treatment Interventions DME instruction;Gait training;Therapeutic exercise;Balance training    PT Goals (Current goals can be found in the Care Plan section)  Acute Rehab PT Goals Patient Stated Goal: to get stronger PT Goal Formulation: With patient Time For Goal Achievement: 07/10/17 Potential to Achieve Goals: Good    Frequency BID   Barriers to discharge Inaccessible home environment;Decreased caregiver support      Co-evaluation               AM-PAC PT "6 Clicks" Daily Activity  Outcome Measure Difficulty turning over in bed (including adjusting bedclothes, sheets and blankets)?: Unable Difficulty moving from lying on back to sitting on the side of the bed? : Unable Difficulty sitting down on and standing up from a chair with arms (e.g., wheelchair, bedside commode, etc,.)?: Unable Help needed moving to and from a bed to chair (including a wheelchair)?: A Lot Help needed walking in hospital room?: A Lot Help needed climbing 3-5 steps with a railing? : A Lot 6 Click Score: 9    End of Session Equipment Utilized During  Treatment: Gait belt Activity Tolerance: Patient tolerated treatment well Patient left: in chair;with chair alarm set Nurse Communication: Mobility status PT Visit Diagnosis: Unsteadiness on feet (R26.81);Muscle weakness (generalized) (M62.81);Difficulty in walking, not elsewhere classified (R26.2);Pain Pain - Right/Left: Left Pain - part of body: Shoulder    Time: 1610-9604 PT Time Calculation (min) (ACUTE ONLY): 29 min   Charges:   PT Evaluation $PT Eval Low Complexity: 1 Low PT Treatments $Therapeutic Exercise: 8-22 mins   PT G Codes:        Elizabeth Palau, PT, DPT [phone]   Courtney [name] 06/26/2017, 1:08 PM

## 2017-06-26 NOTE — Clinical Social Work Note (Signed)
Clinical Social Work Assessment  Patient Details  Name: Courtney [name] MRN: [id number] Date of Birth: 17-Aug-1955  Date of referral:  06/26/17               Reason for consult:  Facility Placement                Permission sought to share information with:    Permission granted to share information::     Name::        Agency::     Relationship::     Contact Information:     Housing/Transportation Living arrangements for the past 2 months:  Single Family Home Source of Information:  Patient Patient Interpreter Needed:  None Criminal Activity/Legal Involvement Pertinent to Current Situation/Hospitalization:  No - Comment as needed Significant Relationships:  Adult Children Lives with:  Adult Children Do you feel safe going back to the place where you live?  Yes Need for family participation in patient care:  Yes (Comment)  Care giving concerns:  Patient lives in [place] with her son Freida Busman.    Social Worker assessment / plan:  Holiday representative (CSW) reviewed chart and noted that PT is recommending SNF. CSW met with patient alone at bedside to discuss D/C plan. Patient was laying in the bedside chair and was alert and oriented X4. CSW introduced self and explained role of CSW department. Patient reported that she lives in [place] and her son Freida Busman stays with her often. CSW explained that PT is recommending SNF however medicare will not pay for it because she is under observation status. CSW explained that medicare will only pay for SNF if a 3 night inpatient qualifying stay in a hospital is completed. CSW explained that patient can be placed in a SNF under her medicaid however she would have to 1) sign over her disability check to the SNF 2) stay at the SNF for 30 days and 3) [facility] to [facility]. Patient reported that she did not want to sign over her disability check to the SNF and stated that she will go home. Patient requested a wheel chair and bedside commode.  CSW made RN case manager aware of above. CSW will continue to follow and assist as needed.   Employment status:  Disabled (Comment on whether or not currently receiving Disability) Insurance information:  Medicare, Medicaid In [place] PT Recommendations:  [place] / Referral to community resources:  [place], Other (Comment Required)(Patient is refusing SNF placement under medicaid. )  Patient/Family's Response to care:  Patient refused SNF placement under medicaid.   Patient/Family's Understanding of and Emotional Response to Diagnosis, Current Treatment, and Prognosis:  Patient was very pleasant and thanked CSW for assistance.   Emotional Assessment Appearance:  Appears stated age Attitude/Demeanor/Rapport:    Affect (typically observed):  Accepting, Adaptable Orientation:  Oriented to Self, Oriented to Place, Oriented to  Time, Oriented to Situation Alcohol / Substance use:  Not Applicable Psych involvement (Current and /or in the community):  No (Comment)  Discharge Needs  Concerns to be addressed:  Discharge Planning Concerns Readmission within the last 30 days:  No Current discharge risk:  Dependent with Mobility Barriers to Discharge:  Continued Medical Work up   [employer], Veronia Beets, LCSW 06/26/2017, 7:09 PM

## 2017-06-26 NOTE — Care Management (Signed)
Attempted to give observation notice and discuss home health. Patient sitting up in chair and is in pain, tearful. Requested that I come back at another time. RNCM to reevaluate tomorrow morning.

## 2017-06-26 NOTE — Progress Notes (Signed)
Physical Therapy Treatment Patient Details Name: Courtney [name] MRN: [id number] DOB: 01/27/1956 Today's Date: 06/26/2017    History of Present Illness Pt admitted for L rotator cuff Sx on 06/25/17. Post surgery stay complicated by elevated glucose and post op L LE weakness. History includes anxiety, depression, GERD, HTN, and DM.     PT Comments    Pt is making limited progress towards goals secondary to increased weakness/pain in L LE. Pt reports numbness in L LE with increased tenderness and pain on dorsal/plantar surface of L foot. Used HW for mobility, however still requires assist for ambulation and +2 for safety. Will continue to progress. Fatigues quickly with exertion.   Follow Up Recommendations  SNF     Equipment Recommendations  (TBD)    Recommendations for Other Services       Precautions / Restrictions Precautions Precautions: Fall;Shoulder Shoulder Interventions: Shoulder abduction pillow;At all times Precaution Booklet Issued: Yes (comment) Restrictions Weight Bearing Restrictions: Yes LUE Weight Bearing: Non weight bearing    Mobility  Bed Mobility Overal bed mobility: Needs Assistance Bed Mobility: Supine to Sit     Supine to sit: Mod assist;+2 for safety/equipment     General bed mobility comments: deferred up in recliner  Transfers Overall transfer level: Needs assistance Equipment used: Hemi-walker Transfers: Sit to/from Stand Sit to Stand: Min assist         General transfer comment: able to stand at bedside with cues for hand placement. +2 required for safety. Once standing, noted limited WBing through L LE  Ambulation/Gait Ambulation/Gait assistance: Mod assist;+2 safety/equipment Ambulation Distance (Feet): 10 Feet Assistive device: Hemi-walker Gait Pattern/deviations: Step-to pattern     General Gait Details: slow step to gait pattern with seated rest break due to fatigue half way from recliner to wall. Very small steps noted and heavy  use of R UE on HW. 2nd person for close follow with recliner   Stairs            Wheelchair Mobility    Modified Rankin (Stroke Patients Only)       Balance Overall balance assessment: Needs assistance Sitting-balance support: Feet supported Sitting balance-Leahy Scale: Good     Standing balance support: Single extremity supported Standing balance-Leahy Scale: Fair                              Cognition Arousal/Alertness: Awake/alert Behavior During Therapy: WFL for tasks assessed/performed Overall Cognitive Status: Within Functional Limits for tasks assessed                                        Exercises Other Exercises Other Exercises: Pt performed L LE LAQ, quad sets, ankle pumps, and hip abd/add. All ther-ex performed x 10 reps with min assist. Too painful to tolerate SLRs.    General Comments        Pertinent Vitals/Pain Pain Assessment: Faces Pain Score: 9  Faces Pain Scale: Hurts even more Pain Location: L UE/L LE (knee) Pain Descriptors / Indicators: Discomfort;Dull Pain Intervention(s): Limited activity within patient's tolerance    Home Living Family/patient expects to be discharged to:: Private residence Living Arrangements: Alone Available Help at Discharge: Family(son nearby who could help) Type of Home: House Home Access: Stairs to enter Entrance Stairs-Rails: (grab rail) Home Layout: One level Home Equipment: Cane - single point;Shower seat - built in  Additional Comments: built in bench in walk in shower    Prior Function Level of Independence: Independent      Comments: not active at home secondary to hip pain, no falls   PT Goals (current goals can now be found in the care plan section) Acute Rehab PT Goals Patient Stated Goal: get stronger PT Goal Formulation: With patient Time For Goal Achievement: 07/10/17 Potential to Achieve Goals: Good Additional Goals Additional Goal #1: Pt will be able to  perform bed mobility/transfers with supervision and LRAD in order to improve functional independence Progress towards PT goals: Progressing toward goals    Frequency    BID      PT Plan Current plan remains appropriate    Co-evaluation              AM-PAC PT "6 Clicks" Daily Activity  Outcome Measure  Difficulty turning over in bed (including adjusting bedclothes, sheets and blankets)?: Unable Difficulty moving from lying on back to sitting on the side of the bed? : Unable Difficulty sitting down on and standing up from a chair with arms (e.g., wheelchair, bedside commode, etc,.)?: Unable Help needed moving to and from a bed to chair (including a wheelchair)?: A Lot Help needed walking in hospital room?: A Lot Help needed climbing 3-5 steps with a railing? : Total 6 Click Score: 8    End of Session Equipment Utilized During Treatment: Gait belt Activity Tolerance: Patient limited by pain Patient left: in chair;with chair alarm set Nurse Communication: Mobility status PT Visit Diagnosis: Unsteadiness on feet (R26.81);Muscle weakness (generalized) (M62.81);Difficulty in walking, not elsewhere classified (R26.2);Pain Pain - Right/Left: Left Pain - part of body: Shoulder     Time: [id number] PT Time Calculation (min) (ACUTE ONLY): 29 min  Charges:  $Gait Training: 8-22 mins $Therapeutic Exercise: 8-22 mins                    G Codes:       Elizabeth PalauStephanie Lalana Wachter, PT, DPT [phone]    Courtney [name] 06/26/2017, 3:00 PM

## 2017-06-26 NOTE — Progress Notes (Signed)
[facility]. Pt alert and oriented X4. IV infusing NS. Pt on room air. Foot pumps and teds applied. No complaints from pt at this time.

## 2017-06-26 NOTE — [place] FL2 (Signed)
[place] MEDICAID FL2 LEVEL OF CARE SCREENING TOOL     IDENTIFICATION  Patient Name: Courtney [name] Birthdate: 01/28/1956 Sex: female Admission Date (Current Location): 06/25/2017  [facility] and [place] Number:  [address] ([id number] [facility]) Facility and Address:  [facility], [address] [place], [place] [address]      Provider Number: [id number]  Attending Physician Name and Address:  Juanell Fairly, MD  Relative Name and Phone Number:       Current Level of Care: Hospital Recommended Level of Care: Skilled Nursing Facility Prior Approval Number:    Date Approved/Denied:   PASRR Number: ([id number])  Discharge Plan: SNF    Current Diagnoses: Patient Active Problem List   Diagnosis Date Noted  . S/P rotator cuff surgery 06/25/2017  . Bad headache 01/15/2016    Orientation RESPIRATION BLADDER Height & Weight     Self, Time, Situation, Place  Normal Continent Weight: 176 lb 8 oz (80.1 kg) Height:  5' (152.4 cm)  BEHAVIORAL SYMPTOMS/MOOD NEUROLOGICAL BOWEL NUTRITION STATUS      Continent Diet(Diet: Carb Modified. )  AMBULATORY STATUS COMMUNICATION OF NEEDS Skin   Extensive Assist Verbally Surgical wounds(Incision: Left Shoulder. )                       Personal Care Assistance Level of Assistance  Bathing, Feeding, Dressing Bathing Assistance: Limited assistance Feeding assistance: Independent Dressing Assistance: Limited assistance     Functional Limitations Info  Sight, Hearing, Speech Sight Info: Adequate Hearing Info: Adequate Speech Info: Adequate    SPECIAL CARE FACTORS FREQUENCY  PT (By licensed PT), OT (By licensed OT)     PT Frequency: (5) OT Frequency: (5)            Contractures      Additional Factors Info  Code Status, Allergies Code Status Info: (Full Code. ) Allergies Info: (Tizanidine, Ace Inhibitors, Codeine, Lidoderm Lidocaine, Naproxen, Niacin And Related, Penicillins, Pravastatin, Prednisone,  Simvastatin, Tramadol)           Current Medications (06/26/2017):  This is the current hospital active medication list Current Facility-Administered Medications  Medication Dose Route Frequency Provider Last Rate Last Dose  . 0.9 %  sodium chloride infusion   Intravenous Continuous Juanell Fairly, MD 75 mL/hr at 06/26/17 0523    . acetaminophen (TYLENOL) tablet 650 mg  650 mg Oral Q4H PRN Juanell Fairly, MD       Or  . acetaminophen (TYLENOL) suppository 650 mg  650 mg Rectal Q4H PRN Juanell Fairly, MD      . alum & mag hydroxide-simeth (MAALOX/MYLANTA) 200-200-20 MG/5ML suspension 30 mL  30 mL Oral Q4H PRN Juanell Fairly, MD      . aspirin EC tablet 81 mg  81 mg Oral Peggyann Juba, MD      . bisacodyl (DULCOLAX) suppository 10 mg  10 mg Rectal Daily PRN Juanell Fairly, MD      . cholecalciferol (VITAMIN D) tablet 2,000 Units  2,000 Units Oral Daily Juanell Fairly, MD   2,000 Units at 06/26/17 1023  . clonazePAM (KLONOPIN) tablet 1 mg  1 mg Oral BID PRN Juanell Fairly, MD      . cyclobenzaprine (FLEXERIL) tablet 5 mg  5 mg Oral TID PRN Juanell Fairly, MD   5 mg at 06/26/17 1022  . diphenhydrAMINE (BENADRYL) 12.5 MG/5ML elixir 12.5-25 mg  12.5-25 mg Oral Q4H PRN Juanell Fairly, MD      . docusate sodium (COLACE) capsule 100 mg  100 mg Oral BID Juanell Fairly, MD   100 mg at 06/26/17 1021  . fluticasone (FLONASE) 50 MCG/ACT nasal spray 2 spray  2 spray Each Nare Daily Juanell Fairly, MD   2 spray at 06/26/17 1029  . glipiZIDE (GLUCOTROL) tablet 10 mg  10 mg Oral BID AC Gouru, Aruna, MD   10 mg at 06/26/17 1710  . irbesartan (AVAPRO) tablet 300 mg  300 mg Oral Daily Juanell Fairly, MD   300 mg at 06/26/17 1024   And  . hydrochlorothiazide (MICROZIDE) capsule 12.5 mg  12.5 mg Oral Daily Juanell Fairly, MD   12.5 mg at 06/26/17 1024  . HYDROmorphone (DILAUDID) injection 0.5 mg  0.5 mg Intravenous Q2H PRN Juanell Fairly, MD      . insulin aspart (novoLOG)  injection 0-5 Units  0-5 Units Subcutaneous QHS Ramonita Lab, MD   5 Units at 06/25/17 2132  . insulin aspart (novoLOG) injection 0-9 Units  0-9 Units Subcutaneous TID WC Gouru, Aruna, MD   3 Units at 06/26/17 1710  . linagliptin (TRADJENTA) tablet 5 mg  5 mg Oral Daily Juanell Fairly, MD   5 mg at 06/26/17 1030  . loratadine (CLARITIN) tablet 10 mg  10 mg Oral Daily Juanell Fairly, MD   10 mg at 06/26/17 1023  . magnesium citrate solution 1 Bottle  1 Bottle Oral Once PRN Juanell Fairly, MD      . menthol-cetylpyridinium (CEPACOL) lozenge 3 mg  1 lozenge Oral PRN Juanell Fairly, MD       Or  . phenol (CHLORASEPTIC) mouth spray 1 spray  1 spray Mouth/Throat PRN Juanell Fairly, MD      . metFORMIN (GLUCOPHAGE) tablet 500 mg  500 mg Oral BID WC Katha Hamming, MD   500 mg at 06/26/17 1710  . methocarbamol (ROBAXIN) tablet 500 mg  500 mg Oral Q6H PRN Juanell Fairly, MD       Or  . methocarbamol (ROBAXIN) 500 mg in dextrose 5 % 50 mL IVPB  500 mg Intravenous Q6H PRN Juanell Fairly, MD      . metoCLOPramide (REGLAN) tablet 5-10 mg  5-10 mg Oral Q8H PRN Juanell Fairly, MD       Or  . metoCLOPramide (REGLAN) injection 5-10 mg  5-10 mg Intravenous Q8H PRN Juanell Fairly, MD      . ondansetron [facility]) tablet 4 mg  4 mg Oral Q6H PRN Juanell Fairly, MD       Or  . ondansetron [facility]) injection 4 mg  4 mg Intravenous Q6H PRN Juanell Fairly, MD      . oxyCODONE (Oxy IR/ROXICODONE) immediate release tablet 10 mg  10 mg Oral Q3H PRN Juanell Fairly, MD   10 mg at 06/26/17 1222  . oxyCODONE (Oxy IR/ROXICODONE) immediate release tablet 5 mg  5 mg Oral Q3H PRN Juanell Fairly, MD   5 mg at 06/26/17 0837  . pantoprazole (PROTONIX) EC tablet 40 mg  40 mg Oral Daily Juanell Fairly, MD   40 mg at 06/26/17 1022  . polyethylene glycol (MIRALAX / GLYCOLAX) packet 17 g  17 g Oral Daily PRN Juanell Fairly, MD   17 g at 06/26/17 1708  . senna (SENOKOT) tablet 8.6 mg  1 tablet Oral BID  Juanell Fairly, MD   8.6 mg at 06/26/17 1023  . zolpidem (AMBIEN) tablet 5 mg  5 mg Oral QHS PRN Juanell Fairly, MD   5 mg at 06/26/17 0005     Discharge Medications: Please see discharge summary for  a list of discharge medications.  Relevant Imaging Results:  Relevant Lab Results:   Additional Information (SSN: [id number])  Makana Feigel, Darleen CrockerBailey M, LCSW

## 2017-06-26 NOTE — Evaluation (Signed)
Occupational Therapy Evaluation Patient Details Name: Courtney [name] MRN: [id number] DOB: [birth year] Today's Date: 06/26/2017    History of Present Illness Pt admitted for L rotator cuff Sx on 06/25/17. Post surgery stay complicated by elevated glucose and post op L LE weakness. History includes anxiety, depression, GERD, HTN, and DM.    Clinical Impression   Pt seen for OT evaluation this date, limited by pain. Pt noted LLE was painful - burning/sore/throbbing with decreased sensation as well as pain in L shoulder with decreased sensation. Pt educated in conservative protocol for shoulder per surgeon including bathing, dressing, sling wear schedule/positioning, polar wear schedule/positioning, no lifting 12-16wks, no driving 4 wks, sling for 4 wks, LUE NWBing, AROM elbow/wrist/hand, sleeping positioning. Handout provided, pt verbalized understanding. Pt will benefit from skilled OT services to provide additional education and training in strategies for ADL tasks, there-ex, falls prevention, strengthening, and AE/DME to maximize safety and return to PLOF. Pt unsafe to return home at this time. Recommend STR following hospitalization.     Follow Up Recommendations  SNF    Equipment Recommendations  3 in 1 bedside commode    Recommendations for Other Services       Precautions / Restrictions Precautions Precautions: Fall;Shoulder Shoulder Interventions: Shoulder abduction pillow;At all times Precaution Booklet Issued: Yes (comment) Restrictions Weight Bearing Restrictions: Yes LUE Weight Bearing: Non weight bearing      Mobility Bed Mobility      General bed mobility comments: deferred up in recliner  Transfers         General transfer comment: unable to attempt due to pain    Balance Overall balance assessment: Needs assistance Sitting-balance support: Feet supported Sitting balance-Leahy Scale: Good                                 ADL either performed or  assessed with clinical judgement   ADL Overall ADL's : Needs assistance/impaired Eating/Feeding: Modified independent   Grooming: Sitting;Set up   Upper Body Bathing: Sitting;Moderate assistance   Lower Body Bathing: Moderate assistance;Maximal assistance;Sitting/lateral leans   Upper Body Dressing : Moderate assistance;Sitting   Lower Body Dressing: Moderate assistance;Maximal assistance;Sitting/lateral leans     Toilet Transfer Details (indicate cue type and reason): unable to attempt due to pain                 Vision Baseline Vision/History: No visual deficits Patient Visual Report: No change from baseline       Perception     Praxis      Pertinent Vitals/Pain Pain Assessment: 0-10 Pain Score: 9  Faces Pain Scale: Hurts even more Pain Location: L UE/L LE (knee) Pain Descriptors / Indicators: Discomfort;Dull Pain Intervention(s): Limited activity within patient's tolerance;Monitored during session;Premedicated before session;Ice applied     Hand Dominance Right   Extremity/Trunk Assessment Upper Extremity Assessment Upper Extremity Assessment: LUE deficits/detail(RUE WFL) LUE Deficits / Details: grip strength 2/5; wrist flexion 3/5; reports numbness persisting LUE: Unable to fully assess due to immobilization   Lower Extremity Assessment Lower Extremity Assessment: Defer to PT evaluation;Generalized weakness   Cervical / Trunk Assessment Cervical / Trunk Assessment: Normal   Communication Communication Communication: No difficulties(hoarse from throat)   Cognition Arousal/Alertness: Awake/alert Behavior During Therapy: WFL for tasks assessed/performed Overall Cognitive Status: Within Functional Limits for tasks assessed  General Comments       Exercises Exercises: Other exercises Other Exercises Other Exercises: pt educated in conservative protocol for shoulder per surgeon including bathing,  dressing, sling wear schedule/positioning, polar wear schedule/positioning, no lifting 12-16wks, no driving 4 wks, sling for 4 wks, LUE NWBing, AROM elbow/wrist/hand, sleeping positioning. Handout provided, pt verbalized understanding.   Shoulder Instructions      Home Living Family/patient expects to be discharged to:: Private residence Living Arrangements: Alone Available Help at Discharge: Family(son nearby who could help) Type of Home: House Home Access: Stairs to enter Entergy CorporationEntrance Stairs-Number of Steps: 1 Entrance Stairs-Rails: (grab rail) Home Layout: One level     Bathroom Shower/Tub: Walk-in shower;Tub/shower unit   Bathroom Toilet: Standard     Home Equipment: Cane - single point;Shower seat - built in   Additional Comments: built in bench in walk in shower      Prior Functioning/Environment Level of Independence: Independent        Comments: not active at home secondary to hip pain, no falls        OT Problem List: Decreased strength;Decreased knowledge of use of DME or AE;Decreased range of motion;Decreased knowledge of precautions;Impaired UE functional use;Decreased activity tolerance;Impaired balance (sitting and/or standing);Decreased safety awareness;Pain;Impaired sensation      OT Treatment/Interventions: Self-care/ADL training;Therapeutic exercise;Therapeutic activities;DME and/or AE instruction;Patient/family education    OT Goals(Current goals can be found in the care plan section) Acute Rehab OT Goals Patient Stated Goal: get stronger OT Goal Formulation: With patient Time For Goal Achievement: 07/10/17 Potential to Achieve Goals: Good  OT Frequency: Min 2X/week   Barriers to D/C:            Co-evaluation              AM-PAC PT "6 Clicks" Daily Activity     Outcome Measure Help from another person eating meals?: None Help from another person taking care of personal grooming?: A Little Help from another person toileting, which includes  using toliet, bedpan, or urinal?: A Lot Help from another person bathing (including washing, rinsing, drying)?: A Lot Help from another person to put on and taking off regular upper body clothing?: A Lot Help from another person to put on and taking off regular lower body clothing?: A Lot 6 Click Score: 15   End of Session    Activity Tolerance: Patient limited by pain Patient left: in chair;with call bell/phone within reach;with chair alarm set;Other (comment)(polar care and sling in place)  OT Visit Diagnosis: Other abnormalities of gait and mobility (R26.89);Pain;Muscle weakness (generalized) (M62.81) Pain - Right/Left: Left Pain - part of body: Shoulder                Time: 1030-1053 OT Time Calculation (min): 23 min Charges:  OT General Charges $OT Visit: 1 Visit OT Evaluation $OT Eval Low Complexity: 1 Low OT Treatments $Self Care/Home Management : 8-22 mins  Richrd PrimeJamie Stiller, MPH, MS, OTR/L ascom [phone] 06/26/17, 1:40 PM

## 2017-06-27 DIAGNOSIS — M25812 Other specified joint disorders, left shoulder: Secondary | ICD-10-CM | POA: Diagnosis not present

## 2017-06-27 LAB — GLUCOSE, CAPILLARY
GLUCOSE-CAPILLARY: 196 mg/dL — AB (ref 65–99)
GLUCOSE-CAPILLARY: 176 mg/dL — AB (ref 65–99)
Glucose-Capillary: 253 mg/dL — ABNORMAL HIGH (ref 65–99)

## 2017-06-27 MED ORDER — OXYCODONE HCL 5 MG PO TABS: 5.0000 mg | ORAL_TABLET | ORAL | 0 refills | Status: DC | PRN

## 2017-06-27 MED ORDER — LIVING WELL WITH DIABETES BOOK
Freq: Once | Status: AC
  Administered 2017-06-27: 14:00:00
  Filled 2017-06-27: qty 1

## 2017-06-27 NOTE — Care Management Obs Status (Signed)
MEDICARE OBSERVATION STATUS NOTIFICATION   Patient Details  Name: Courtney [name] MRN: [id number] Date of Birth: April 28, 1956   Medicare Observation Status Notification Given:  Yes    Marily MemosLisa M Nathin Saran, RN 06/27/2017, 10:56 AM

## 2017-06-27 NOTE — Progress Notes (Signed)
Physical Therapy Treatment Patient Details Name: Courtney [name] MRN: [id number] DOB: Sep 28, 1955 Today's Date: 06/27/2017    History of Present Illness Pt admitted for L rotator cuff Sx on 06/25/17. Post surgery stay complicated by elevated glucose and post op L LE weakness. History includes anxiety, depression, GERD, HTN, and DM.     PT Comments    Pt is making good progress towards goals with improved stability and safety with ambulation using AFO and HW. Able to navigate step to safely enter/exit home. Improved functional independence with HW, needing less assist. Good endurance with there-ex, still limited by pain.   Follow Up Recommendations  Home health PT;Supervision for mobility/OOB     Equipment Recommendations  3in1 (PT)(hemiwalker)    Recommendations for Other Services       Precautions / Restrictions Precautions Precautions: Fall;Shoulder Shoulder Interventions: Shoulder abduction pillow;At all times Precaution Booklet Issued: Yes (comment) Restrictions Weight Bearing Restrictions: Yes LUE Weight Bearing: Non weight bearing    Mobility  Bed Mobility               General bed mobility comments: not performed as pt received in recliner  Transfers Overall transfer level: Needs assistance Equipment used: Hemi-walker Transfers: Sit to/from Stand Sit to Stand: Min guard         General transfer comment: improved transfer performed with safe technique. Cues given for correct hand palcement prior to transfer. Shoes donned with AFO brace  Ambulation/Gait Ambulation/Gait assistance: Min guard Ambulation Distance (Feet): 5 Feet Assistive device: Hemi-walker Gait Pattern/deviations: Step-to pattern     General Gait Details: slow step to gait pattern with HW and safe technique. Improved steps noted this session on L foot due to AFO brace   Stairs Stairs: Yes   Stair Management: No rails;Step to pattern Number of Stairs: 1 General stair comments: Pt  navigated the stairs x 2 with HW and min assist including cues for sequencing. Pt able to demonstrate safe technique.  Wheelchair Mobility    Modified Rankin (Stroke Patients Only)       Balance                                            Cognition Arousal/Alertness: Awake/alert Behavior During Therapy: WFL for tasks assessed/performed Overall Cognitive Status: Within Functional Limits for tasks assessed                                        Exercises Other Exercises Other Exercises: Seated ther-ex performed on L LE including ankle pumps, quad sets, SLRs, LAQ, and hip abd/add. Pt performed all ther-ex x 15 reps with cga. Improved strength noted on L LE.    General Comments        Pertinent Vitals/Pain Pain Assessment: Faces Faces Pain Scale: Hurts a little bit Pain Location: L UE/L LE (knee) Pain Descriptors / Indicators: Discomfort;Dull Pain Intervention(s): Limited activity within patient's tolerance;Premedicated before session    Home Living                      Prior Function            PT Goals (current goals can now be found in the care plan section) Acute Rehab PT Goals Patient Stated Goal: get stronger PT Goal Formulation: With  patient Time For Goal Achievement: 07/10/17 Potential to Achieve Goals: Good Progress towards PT goals: Progressing toward goals    Frequency    BID      PT Plan Current plan remains appropriate    Co-evaluation              AM-PAC PT "6 Clicks" Daily Activity  Outcome Measure  Difficulty turning over in bed (including adjusting bedclothes, sheets and blankets)?: A Lot Difficulty moving from lying on back to sitting on the side of the bed? : Unable Difficulty sitting down on and standing up from a chair with arms (e.g., wheelchair, bedside commode, etc,.)?: Unable Help needed moving to and from a bed to chair (including a wheelchair)?: A Little Help needed walking in  hospital room?: A Little Help needed climbing 3-5 steps with a railing? : A Lot 6 Click Score: 12    End of Session Equipment Utilized During Treatment: Gait belt Activity Tolerance: Patient tolerated treatment well Patient left: in chair;with chair alarm set;with SCD's reapplied Nurse Communication: Mobility status PT Visit Diagnosis: Unsteadiness on feet (R26.81);Muscle weakness (generalized) (M62.81);Difficulty in walking, not elsewhere classified (R26.2);Pain Pain - Right/Left: Left Pain - part of body: Shoulder     Time: 1317-1340 PT Time Calculation (min) (ACUTE ONLY): 23 min  Charges:  $Gait Training: 8-22 mins $Therapeutic Exercise: 8-22 mins                    G Codes:       Courtney [name], PT, DPT [phone]    Courtney [name] 06/27/2017, 2:41 PM

## 2017-06-27 NOTE — Progress Notes (Signed)
Chaplain responded to an OR for pt who had requested for prayer. CH met with pt at bedside. Pt was sitting on the recliner chair and stated she was pain. [place] listened to the pt and upon pt's request, [place] offered prayer and ministry of presence. Note: This pt might benefit from a followup visit by a chaplain.   06/27/17 1100  Clinical Encounter Type  Visited With Patient  Visit Type Initial;Spiritual support  Referral From Nurse  Consult/Referral To Chaplain  Spiritual Encounters  Spiritual Needs Prayer

## 2017-06-27 NOTE — Progress Notes (Signed)
Occupational Therapy Treatment Patient Details Name: Courtney [name] MRN: [id number] DOB: 19-May-1956 Today's Date: 06/27/2017    History of present illness Pt admitted for L rotator cuff Sx on 06/25/17. Post surgery stay complicated by elevated glucose and post op L LE weakness. History includes anxiety, depression, GERD, HTN, and DM.    OT comments  Pt seen for OT session focused on SPT to [facility] for toileting, requiring min guard for mobility and set up for hygiene. Pt reporting headache and shoulder pain. Adjusted polar care positioning and sling positioning with pt noting improvement in pain and comfort. Reviewed education in positioning of sling and polar care to maximize safety, positioning, LUE precautions, and minimizing risk of skin breakdown. Notified RN of request for pain meds. Will continue to progress.   Follow Up Recommendations  SNF    Equipment Recommendations  3 in 1 bedside commode    Recommendations for Other Services      Precautions / Restrictions Precautions Precautions: Fall;Shoulder Shoulder Interventions: Shoulder abduction pillow;At all times Precaution Booklet Issued: Yes (comment) Restrictions Weight Bearing Restrictions: Yes LUE Weight Bearing: Non weight bearing       Mobility Bed Mobility Overal bed mobility: Needs Assistance Bed Mobility: Supine to Sit;Sit to Supine     Supine to sit: Min guard Sit to supine: Min guard   General bed mobility comments: not performed as pt received in recliner  Transfers Overall transfer level: Needs assistance Equipment used: None Transfers: Stand Pivot Transfers Sit to Stand: Min guard Stand pivot transfers: Min guard       General transfer comment: improved transfer performed with safe technique. Cues given for correct hand palcement prior to transfer. Shoes donned with AFO brace    Balance Overall balance assessment: Needs assistance Sitting-balance support: Feet supported Sitting balance-Leahy Scale:  Good     Standing balance support: No upper extremity supported Standing balance-Leahy Scale: Fair                             ADL either performed or assessed with clinical judgement   ADL                           Toilet Transfer: Min guard;Stand-pivot;BSC   Toileting- Clothing Manipulation and Hygiene: Sit to/from stand;Set up;Min guard         General ADL Comments: reviewed positioning of sling and polar care to maximize safety, positioning, LUE precautions, and minimizing risk of skin breakdown     Vision Patient Visual Report: No change from baseline     Perception     Praxis      Cognition Arousal/Alertness: Awake/alert Behavior During Therapy: WFL for tasks assessed/performed Overall Cognitive Status: Within Functional Limits for tasks assessed                                          Exercises    Shoulder Instructions       General Comments polar care shifted slightly exposing skin to polar care, readjusted to ensure full coverage of skin with improvement in comfort per pt report    Pertinent Vitals/ Pain       Pain Assessment: 0-10 Pain Score: 10-Worst pain ever Faces Pain Scale: Hurts a little bit Pain Location: 10/10 headache and 10/10 LUE shoulder pain decreasing after adustment  of sling and polar care Pain Descriptors / Indicators: Aching Pain Intervention(s): Limited activity within patient's tolerance;Monitored during session;Repositioned;Ice applied  Home Living                                          Prior Functioning/Environment              Frequency  Min 2X/week        Progress Toward Goals  OT Goals(current goals can now be found in the care plan section)  Progress towards OT goals: Progressing toward goals  Acute Rehab OT Goals Patient Stated Goal: get stronger OT Goal Formulation: With patient Time For Goal Achievement: 07/10/17 Potential to Achieve Goals:  Good  Plan Discharge plan remains appropriate;Frequency remains appropriate    Co-evaluation                 AM-PAC PT "6 Clicks" Daily Activity     Outcome Measure   Help from another person eating meals?: None Help from another person taking care of personal grooming?: A Little Help from another person toileting, which includes using toliet, bedpan, or urinal?: A Little Help from another person bathing (including washing, rinsing, drying)?: A Lot Help from another person to put on and taking off regular upper body clothing?: A Lot Help from another person to put on and taking off regular lower body clothing?: A Lot 6 Click Score: 16    End of Session    OT Visit Diagnosis: Other abnormalities of gait and mobility (R26.89);Pain;Muscle weakness (generalized) (M62.81) Pain - Right/Left: Left Pain - part of body: Shoulder   Activity Tolerance Patient tolerated treatment well   Patient Left in bed;with call bell/phone within reach;with bed alarm set;Other (comment)(polar care and shoulder sling in optimal positioning)   Nurse Communication Patient requests pain meds        Time: 1557-1620 OT Time Calculation (min): 23 min  Charges: OT General Charges $OT Visit: 1 Visit OT Treatments $Self Care/Home Management : 23-37 mins  Richrd PrimeJamie Stiller, MPH, MS, OTR/L ascom [phone] 06/27/17, 5:06 PM

## 2017-06-27 NOTE — Progress Notes (Signed)
Being discharged by orthopedic service.

## 2017-06-27 NOTE — Progress Notes (Signed)
OT Cancellation Note  Patient Details Name: Courtney [name] MRN: [id number] DOB: Oct 26, 1955   Cancelled Treatment:    Reason Eval/Treat Not Completed: Fatigue/lethargy limiting ability to participate. Upon attempt, pt with cpap on, sleeping. Wakes to OT's voice, declining therapy at this time due to fatigue. Agreeable to OT coming back later this afternoon prior to discharge.  Richrd PrimeJamie Stiller, MPH, MS, OTR/L ascom [phone] 06/27/17, 3:00 PM

## 2017-06-27 NOTE — Progress Notes (Signed)
  Subjective:  POD #2  S/p left to the cuff repair complicated by left lower extremity weakness. Patient reports left shoulder pain as mild.  Patient shoulder block is still working to a certain degree. She is up out of bed to a chair. She is tolerating by mouth diet. Her larger me weakness is slowly improving.  Objective:   VITALS:   Vitals:   06/26/17 2009 06/27/17 0431 06/27/17 0821 06/27/17 0900  BP: (!) 102/50 127/61 (!) 95/48 (!) 108/50  Pulse: 86 81 71 87  Resp: 16 16 16    Temp: 98 F (36.7 C) 98.5 F (36.9 C) 98 F (36.7 C)   TempSrc: Oral Oral Oral   SpO2: 98% 94% 99%   Weight:      Height:        PHYSICAL EXAM: Left lower extremity: Patient can gently flex and extend her toes. She can slightly dorsiflex her ankle. She has palpable pedal pulses. Sensation in her left foot is improving compared to yesterday and the day before.  Left shoulder: Patient's dressing is clean dry and intact. Patient still has some remaining paresthesias in her left hand. She has full motion to her left fingers. Her abduction sling is in place.   LABS  Results for orders placed or performed during the hospital encounter of 06/25/17 (from the past 24 hour(s))  Glucose, capillary     Status: Abnormal   Collection Time: 06/26/17 12:14 PM  Result Value Ref Range   Glucose-Capillary 212 (H) 65 - 99 mg/dL   Comment 1 Notify RN   Glucose, capillary     Status: Abnormal   Collection Time: 06/26/17  4:43 PM  Result Value Ref Range   Glucose-Capillary 230 (H) 65 - 99 mg/dL   Comment 1 Notify RN   Glucose, capillary     Status: Abnormal   Collection Time: 06/26/17  9:40 PM  Result Value Ref Range   Glucose-Capillary 317 (H) 65 - 99 mg/dL   Comment 1 Notify RN   Glucose, capillary     Status: Abnormal   Collection Time: 06/27/17  7:43 AM  Result Value Ref Range   Glucose-Capillary 196 (H) 65 - 99 mg/dL    No results found.  Assessment/Plan: 2 Days Post-Op   Active Problems:   S/P  rotator cuff surgery  Patient has been ordered for an AFO brace from [employer]. I personally called [employer] to order this.  Patient will be discharged home today. She will receive home health PT and OT. I'm also ordering her a single handed walker and wheelchair.  He has an appointment to follow up with me in the office in a week.    Juanell FairlyKRASINSKI, Berklie Dethlefs , MD 06/27/2017, 10:14 AM

## 2017-06-27 NOTE — Progress Notes (Signed)
Pt alert and oriented X4. Discharge instructions and prescriptions given to pt. IV removed. Pt dressed, waiting on ride to be discharged.

## 2017-06-27 NOTE — Care Management Note (Signed)
Case Management Note  Patient Details  Name: Clovis Warwick MRN: [id number] Date of Birth: November 29, 1955  Subjective/Objective:  POD # 2 left rotator cuff repair. Met with patient at bedside to discuss discharge planning. Patient lives alone but has a very supportive son and daughter. Prior to admission patient reports she was independent with ADL's  She will need a hemi walker and bsc. Ordered from [place] with [facility]. Offered choice of home health agencies. Referral to [facility] with [facility] for PT, OT and HHA. PCP is at [facility]. Daughter will transport patient home today                 Action/Plan: [facility] Cor[facility] with [facility] for PT, OT and HHA. [facility] for hemiwalker and bsc.   Expected Discharge Date:  06/27/17               Expected Discharge Plan:  [place]  In-House Referral:     Discharge planning Services  CM Consult  Post Acute Care Choice:  Durable Medical Equipment, Home Health Choice offered to:  Patient  DME Arranged:  Bedside commode, Walker hemi DME Agency:  [place] Arranged:  PT, OT, Nurse's Aide [place] Agency:  [place]  Status of Service:  Completed, signed off  If discussed at [place] of Stay Meetings, dates discussed:    Additional Comments:  Jolly Mango, RN 06/27/2017, 11:04 AM

## 2017-06-27 NOTE — Progress Notes (Signed)
Physical Therapy Treatment Patient Details Name: Courtney [name] MRN: [id number] DOB: 05/14/1956 Today's Date: 06/27/2017    History of Present Illness Pt admitted for L rotator cuff Sx on 06/25/17. Post surgery stay complicated by elevated glucose and post op L LE weakness. History includes anxiety, depression, GERD, HTN, and DM.     PT Comments    Pt is making gradual progress towards goals with improved ambulation distance this session and safety with HW. Still limited to very short distances due to pain, fatigue, weakness. Needs assist for all mobility at this time. For long distances will need Wheel chair.  Patient suffers from L rotator cuff surgery with L LE nerve palsy which impairs her ability to perform daily activities like toileting, dressing, grooming, bathing, ambulation in the home. A Hemiwalker will not resolve the patient's issue with performing activities of daily living. A wheelchair is required/recommended and will allow patient to safely perform daily activities. Patient can safely propel the wheelchair in the home or has a caregiver who can provide assistance. Improved endurance with B LE there-ex. Will continue to progress.    Follow Up Recommendations  SNF     Equipment Recommendations  Wheelchair (measurements PT);3in1 (PT)(Hemi walker)    Recommendations for Other Services       Precautions / Restrictions Precautions Precautions: Fall;Shoulder Shoulder Interventions: Shoulder abduction pillow;At all times Precaution Booklet Issued: Yes (comment) Restrictions Weight Bearing Restrictions: Yes LUE Weight Bearing: Non weight bearing    Mobility  Bed Mobility Overal bed mobility: Needs Assistance Bed Mobility: Supine to Sit     Supine to sit: Min guard     General bed mobility comments: improved bed mobility performed this date with only slight assist for trunk stability. Once seated at EOB, slight dizziness noted, however improves  quickly  Transfers Overall transfer level: Needs assistance Equipment used: Hemi-walker Transfers: Sit to/from Stand Sit to Stand: Min assist         General transfer comment: initial min assist required for standing, however able to improve to only requiring cga. Improved safety awareness noted with HW.  Ambulation/Gait Ambulation/Gait assistance: Min assist;+2 safety/equipment Ambulation Distance (Feet): 40 Feet Assistive device: Hemi-walker Gait Pattern/deviations: Step-to pattern     General Gait Details: slow step to gait pattern with 1 seated rest break required. Cues still needed for use of HW and sequencing. Needs +2 for chair follow due to fatigue with exertion. No LOB noted   Stairs            Wheelchair Mobility    Modified Rankin (Stroke Patients Only)       Balance                                            Cognition Arousal/Alertness: Awake/alert Behavior During Therapy: WFL for tasks assessed/performed Overall Cognitive Status: Within Functional Limits for tasks assessed                                        Exercises Other Exercises Other Exercises: Supine ther-ex performed on B LE including ankle pumps, quad sets, hip abd/add, and SAQ. All ther-ex performed x 12 reps with min assist for L LE. Still with limited ability for dorsiflexion.    General Comments        Pertinent  Vitals/Pain Pain Assessment: 0-10 Pain Score: 10-Worst pain ever Pain Location: L UE/L LE (knee) Pain Descriptors / Indicators: Discomfort;Dull Pain Intervention(s): Limited activity within patient's tolerance;Patient requesting pain meds-RN notified;Ice applied    Home Living                      Prior Function            PT Goals (current goals can now be found in the care plan section) Acute Rehab PT Goals Patient Stated Goal: get stronger PT Goal Formulation: With patient Time For Goal Achievement:  07/10/17 Potential to Achieve Goals: Good Progress towards PT goals: Progressing toward goals    Frequency    BID      PT Plan Current plan remains appropriate    Co-evaluation              AM-PAC PT "6 Clicks" Daily Activity  Outcome Measure  Difficulty turning over in bed (including adjusting bedclothes, sheets and blankets)?: A Lot Difficulty moving from lying on back to sitting on the side of the bed? : Unable Difficulty sitting down on and standing up from a chair with arms (e.g., wheelchair, bedside commode, etc,.)?: Unable Help needed moving to and from a bed to chair (including a wheelchair)?: A Little Help needed walking in hospital room?: A Little Help needed climbing 3-5 steps with a railing? : A Lot 6 Click Score: 12    End of Session Equipment Utilized During Treatment: Gait belt Activity Tolerance: Patient limited by pain Patient left: in chair;with chair alarm set;with SCD's reapplied Nurse Communication: Mobility status PT Visit Diagnosis: Unsteadiness on feet (R26.81);Muscle weakness (generalized) (M62.81);Difficulty in walking, not elsewhere classified (R26.2);Pain Pain - Right/Left: Left Pain - part of body: Shoulder     Time: [id number] PT Time Calculation (min) (ACUTE ONLY): 40 min  Charges:  $Gait Training: 23-37 mins $Therapeutic Exercise: 8-22 mins                    G Codes:       Courtney PalauStephanie Takyla [name], PT, DPT [phone]    Courtney [name] 06/27/2017, 10:09 AM

## 2017-06-27 NOTE — Discharge Summary (Signed)
Physician Discharge Summary  Patient ID: Courtney [name] MRN: [id number] DOB/AGE: [birth year] 62 y.o.  Admit date: 06/25/2017 Discharge date: 06/27/2017  Admission Diagnoses:  M75.112 Incomplete rotatr-cuff tear/ruptr of l shoulder, not trauma  Discharge Diagnoses:  M75.112 Incomplete rotatr-cuff tear/ruptr of l shoulder, not trauma Active Problems:   S/P rotator cuff surgery Diabetes mellitus  Past Medical History:  Diagnosis Date  . Anxiety and depression   . Chronic back pain   . Diabetes mellitus without complication (HCC)   . GERD (gastroesophageal reflux disease)   . Hyperlipidemia   . Hypertension   . Insomnia   . Obesity   . Sleep apnea   . Trochanteric bursitis     Surgeries: Procedure(s): SHOULDER ARTHROSCOPY WITH OPEN ROTATOR CUFF REPAIR,DISTAL CLAVICLE EXCISION, & SUBACROMINAL DECOMPRESSION on 06/25/2017   Consultants (if any): Treatment Team:  Ramonita LabGouru, Aruna, MD Katha HammingKonidena, Snehalatha, MD  Discharged Condition: Improved  Hospital Course: Courtney [name] is an 62 y.o. female who was admitted 06/25/2017 with a diagnosis of  M75.112 Incomplete rotatr-cuff tear and went to the operating room on 06/25/2017 and underwent repair of the left supraspinatus tear.  Post-operatively patient had weakness in her left lower extremity and paresthesias likely from surgical positioning. Given her fall risk and she was admitted to the hospital for PT and OT.  Patient's weakness times improved during her hospitalization but has not resolved. An AFO brace was ordered for her. She was also ordered a wheelchair and a single arm walker. Given her clinical improvement she is prepared for discharge home with home health PT and OT. She'll follow-up with me in 1 week for reevaluation and wound check.  She was given perioperative antibiotics:  Anti-infectives (From admission, onward)   Start     Dose/Rate Route Frequency Ordered Stop   06/25/17 1730  ceFAZolin (ANCEF) 1 g in dextrose 5 % 50 mL IVPB     1  g 120 mL/hr over 30 Minutes Intravenous Every 6 hours 06/25/17 1640 06/26/17 0538   06/25/17 0738  vancomycin (VANCOCIN) 1-5 GM/200ML-% IVPB    Comments:  Dahlia BailiffKennedy, Denise   : cabinet override      06/25/17 0738 06/25/17 1959   06/25/17 0342  vancomycin (VANCOCIN) IVPB 1000 mg/200 mL premix     1,000 mg 200 mL/hr over 60 Minutes Intravenous On call to O.R. 06/25/17 [id number] 06/25/17 0902    .  She was given sequential compression devices, early ambulation for DVT prophylaxis.  She benefited maximally from the hospital stay and there were no complications.    Recent vital signs:  Vitals:   06/27/17 0821 06/27/17 0900  BP: (!) 95/48 (!) 108/50  Pulse: 71 87  Resp: 16   Temp: 98 F (36.7 C)   SpO2: 99%     Recent laboratory studies:  Lab Results  Component Value Date   HGB 10.5 (L) 06/26/2017   HGB 12.8 05/01/2016   HGB 12.0 04/05/2016   Lab Results  Component Value Date   WBC 15.5 (H) 06/26/2017   PLT 325 06/26/2017   Lab Results  Component Value Date   INR 0.90 05/17/2017   Lab Results  Component Value Date   NA 139 06/26/2017   K 3.1 (L) 06/26/2017   CL 106 06/26/2017   CO2 25 06/26/2017   BUN 13 06/26/2017   CREATININE 0.68 06/26/2017   GLUCOSE 135 (H) 06/26/2017    Discharge Medications:   Allergies as of 06/27/2017      Reactions   Tizanidine Other (See  Comments)   Dizzy and chest pain   Ace Inhibitors Cough   Codeine Itching   With Tylenol   Lidoderm [lidocaine] Itching   Patch    Naproxen Itching   Niacin And Related    nausea   Penicillins Itching   Has patient had a PCN reaction causing immediate rash, facial/tongue/throat swelling, SOB or lightheadedness with hypotension: no Has patient had a PCN reaction causing severe rash involving mucus membranes or skin necrosis: no Has patient had a PCN reaction that required hospitalization: no Has patient had a PCN reaction occurring within the last 10 years: no If all of the above answers are "NO",  then may proceed with Cephalosporin use.     Pravastatin Other (See Comments)   Fatigue, headache    Prednisone Itching   Simvastatin Itching   Tramadol Other (See Comments)   Chest pain      Medication List    TAKE these medications   aspirin EC 81 MG tablet Take 81 mg by mouth every other day.   cholecalciferol 1000 units tablet Commonly known as:  VITAMIN D Take 2,000 Units by mouth daily.   clonazePAM 1 MG tablet Commonly known as:  KLONOPIN Take 1 mg by mouth 2 (two) times daily as needed for anxiety.   cyclobenzaprine 5 MG tablet Commonly known as:  FLEXERIL Take 5 mg by mouth 3 (three) times daily as needed for muscle spasms.   fluticasone 50 MCG/ACT nasal spray Commonly known as:  FLONASE Place 2 sprays into both nostrils daily.   glipiZIDE-metformin 5-500 MG tablet Commonly known as:  METAGLIP Take 2 tablets by mouth 2 (two) times daily before a meal.   loratadine 10 MG tablet Commonly known as:  CLARITIN Take 10 mg by mouth daily.   olmesartan-hydrochlorothiazide 40-12.5 MG tablet Commonly known as:  BENICAR HCT Take 1 tablet by mouth daily.   omeprazole 20 MG capsule Commonly known as:  PRILOSEC Take 20 mg by mouth daily.   ondansetron 4 MG tablet Commonly known as:  ZOFRAN Take 1 tablet (4 mg total) by mouth every 8 (eight) hours as needed for nausea or vomiting.   oxyCODONE 5 MG immediate release tablet Commonly known as:  Oxy IR/ROXICODONE Take 1 tablet (5 mg total) by mouth every 4 (four) hours as needed for severe pain.   oxyCODONE 5 MG immediate release tablet Commonly known as:  Oxy IR/ROXICODONE Take 1 tablet (5 mg total) by mouth every 4 (four) hours as needed for moderate pain ((score 4 to 6)).   sitaGLIPtin 100 MG tablet Commonly known as:  JANUVIA Take 100 mg by mouth daily.   zolpidem 10 MG tablet Commonly known as:  AMBIEN Take 10 mg by mouth at bedtime.            Durable Medical Equipment  (From admission, onward)         Start     Ordered   06/27/17 1018  For home use only DME standard manual wheelchair with seat cushion  Once    Comments:  Patient suffers from left lower extremity weakness which impairs their ability to perform daily activities like bathing, dressing, feeding, grooming and toileting in the home.  A cane, crutch or walker will not resolve  issue with performing activities of daily living. A wheelchair will allow patient to safely perform daily activities. Patient can safely propel the wheelchair in the home or has a caregiver who can provide assistance.  Accessories: elevating leg rests (ELRs), wheel  locks, extensions and anti-tippers.   06/27/17 1021   06/25/17 1720  For home use only DME continuous positive airway pressure (CPAP)  Once    Question Answer Comment  Patient has OSA or probable OSA Yes   Is the patient currently using CPAP in the home Yes   Settings Other see comments   CPAP supplies needed Mask, headgear, cushions, filters, heated tubing and water chamber      06/25/17 1719      Diagnostic Studies: No results found.  Disposition:  Home with Home Health Services  Discharge Instructions    Call MD / Call 911   Complete by:  As directed    If you experience chest pain or shortness of breath, CALL 911 and be transported to the hospital emergency room.  If you develope a fever above 101 F, pus (white drainage) or increased drainage or redness at the wound, or calf pain, call your surgeon's office.          Constipation Prevention   Complete by:  As directed    Drink plenty of fluids.  Prune juice may be helpful.  You may use a stool softener, such as Colace (over the counter) 100 mg twice a day.  Use MiraLax (over the counter) for constipation as needed.   Constipation Prevention   Complete by:  As directed    Drink plenty of fluids.  Prune juice may be helpful.  You may use a stool softener, such as Colace (over the counter) 100 mg twice a day.  Use MiraLax (over  the counter) for constipation as needed.   Diet Carb Modified   Complete by:  As directed        Discharge instructions   Complete by:  As directed    Wear sling at all times, including sleep.  You will need to use the sling for a total of 4 weeks following surgery.  Do not try and lift your arm up or away from your body for any reason.   Keep the dressing dry.  You may remove bandage in 3 days.  Leave the Steri-Strips (white medical tape) in place.  You may place additional Band-Aids over top of the Steri-Strips if you wish.  May shower once dressing is removed in 3 days.  Remove sling carefully only for showers, leaving arm down by your side while in the shower.   You may be most comfortable sleeping in a recliner.  If you do sleep in near bed, placed pillows behind the shoulder that have the operation to support it.  USE AFO brace on left lower extremity while standing or ambulating until full left lower extremity strength returns.     Discharge instructions   Complete by:  As directed                         Increase activity slowly as tolerated   Complete by:  As directed    Lifting restrictions   Complete by:  As directed    No lifting for 12-16 weeks   Lifting restrictions   Complete by:  As directed    No lifting for 12-16 weeks      Follow-up Information    Juanell Fairly, MD Follow up on 07/02/2017.   Specialty:  Orthopedic Surgery Why:  At 4:15 pm. Contact information: [address] [place] [address] [phone]            Signed: Juanell Fairly ,  MD 06/27/2017, 10:25 AM

## 2017-06-27 NOTE — Progress Notes (Addendum)
Inpatient Diabetes Program Recommendations  AACE/ADA: New Consensus Statement on Inpatient Glycemic Control (2015)  Target Ranges:  Prepandial:   less than 140 mg/dL      Peak postprandial:   less than 180 mg/dL (1-2 hours)      Critically ill patients:  140 - 180 mg/dL   Lab Results  Component Value Date   GLUCAP 196 (H) 06/27/2017   HGBA1C 9.6 (H) 06/26/2017    Review of Glycemic Control  Results for Courtney [name], Courtney [name] (MRN [id number]) as of 06/27/2017 08:52  Ref. Range 06/26/2017 07:46 06/26/2017 12:14 06/26/2017 16:43 06/26/2017 21:40 06/27/2017 07:43  Glucose-Capillary Latest Ref Range: 65 - 99 mg/dL 148 (H) 212 (H) 230 (H) 317 (H) 196 (H)   Diabetes history: Type 2 Outpatient Diabetes medications: Metaglip 5-540m bid, Januvia 1069mqday  Current orders for Inpatient glycemic control: Metformin 50068mid, Tragenta 5mg7may, Novolog 0-9 units tid, Novolog 0-5 units qhs , glucotrol 10mg30my  Inpatient Diabetes Program Recommendations:  Please consider increasing Novolog sensitive correction to moderate correction Novolog 0-15 units tid.     Met with patient in her room to discuss high A1C- she tells me she has been having steroids over the past few months and she has been in pain- likely contributing to the high A1C.   She tells me she is having steroid injections in her hips on a regular basis. Based on the A1C, she appears to need more medication to aid in healing.    She has been skipping meals at home and experiencing some low blood sugars - about 3-4 times per week.  She has treated the low blood sugars with juice and Splenda. Re-educated on the use of 4 oz of juice, followed by peanut butter crackers 15 minutes later.   Reviewed the use of the plate method for eating (she eats cereal, milk and a banana at breakfast), skips lunch and eats supper around  6pm.  Recommended an hs snack to prevent low blood sugar through the night.  RD consult placed, outpatient diabetes and  Living well with  diabetes book ordered.   JulieGentry Fitz BA, MHA, CDE Diabetes Coordinator Inpatient Diabetes Program  [phone] Pager) [phone] [place]0/2019 8:55 AM

## 2017-09-04 ENCOUNTER — Ambulatory Visit
Admission: EM | Admit: 2017-09-04 | Discharge: 2017-09-04 | Disposition: A | Discharge status: Home / Self Care | Payer: Medicare Other | Attending: Family Medicine | Admitting: Family Medicine

## 2017-09-04 ENCOUNTER — Other Ambulatory Visit: Payer: Self-pay

## 2017-09-04 DIAGNOSIS — H6502 Acute serous otitis media, left ear: Secondary | ICD-10-CM

## 2017-09-04 DIAGNOSIS — J0100 Acute maxillary sinusitis, unspecified: Secondary | ICD-10-CM

## 2017-09-04 MED ORDER — BENZONATATE 200 MG PO CAPS: 200.0000 mg | ORAL_CAPSULE | Freq: Three times a day (TID) | ORAL | 0 refills | Status: DC | PRN

## 2017-09-04 MED ORDER — AZITHROMYCIN 250 MG PO TABS: ORAL_TABLET | ORAL | 0 refills | Status: DC

## 2017-09-04 NOTE — ED Provider Notes (Signed)
[facility]    CSN: [id number] Arrival date & time: 09/04/17  1316     History   Chief Complaint Chief Complaint  Patient presents with  . Generalized Body Aches    HPI Courtney [name] is a 62 y.o. female.   The history is provided by the patient.  URI  Presenting symptoms: congestion, cough, facial pain and rhinorrhea   Severity:  Moderate Onset quality:  Sudden Duration:  5 days Timing:  Constant Progression:  Worsening Chronicity:  New Relieved by:  Nothing Ineffective treatments:  OTC medications Associated symptoms: sinus pain   Associated symptoms: no wheezing   Risk factors: diabetes mellitus     Past Medical History:  Diagnosis Date  . Anxiety and depression   . Chronic back pain   . Diabetes mellitus without complication (HCC)   . GERD (gastroesophageal reflux disease)   . Hyperlipidemia   . Hypertension   . Insomnia   . Obesity   . Sleep apnea   . Trochanteric bursitis     Patient Active Problem List   Diagnosis Date Noted  . S/P rotator cuff surgery 06/25/2017  . Bad headache 01/15/2016    Past Surgical History:  Procedure Laterality Date  . BACK SURGERY    . HEMORROIDECTOMY    . LUMBAR DISC SURGERY  1999  . right shoulder surgery      rotator cuff repair  . SHOULDER ARTHROSCOPY WITH OPEN ROTATOR CUFF REPAIR Left 06/25/2017   Procedure: SHOULDER ARTHROSCOPY WITH OPEN ROTATOR CUFF REPAIR,DISTAL CLAVICLE EXCISION, & SUBACROMINAL DECOMPRESSION;  Surgeon: Juanell Fairly, MD;  Location: [facility] ORS;  Service: Orthopedics;  Laterality: Left;    OB History    No data available       Home Medications    Prior to Admission medications   Medication Sig Start Date End Date Taking? Authorizing Provider  aspirin EC 81 MG tablet Take 81 mg by mouth every other day.   Yes [provider]  cholecalciferol (VITAMIN D) 1000 units tablet Take 2,000 Units by mouth daily.    Yes [provider]  clonazePAM (KLONOPIN) 1 MG  tablet Take 1 mg by mouth 2 (two) times daily as needed for anxiety.    Yes [provider]  cyclobenzaprine (FLEXERIL) 5 MG tablet Take 5 mg by mouth 3 (three) times daily as needed for muscle spasms.   Yes [provider]  fluticasone (FLONASE) 50 MCG/ACT nasal spray Place 2 sprays into both nostrils daily.   Yes [provider]  glipiZIDE-metformin (METAGLIP) 5-500 MG tablet Take 2 tablets by mouth 2 (two) times daily before a meal.   Yes [provider]  loratadine (CLARITIN) 10 MG tablet Take 10 mg by mouth daily.   Yes [provider]  olmesartan-hydrochlorothiazide (BENICAR HCT) 40-12.5 MG tablet Take 1 tablet by mouth daily.   Yes [provider]  omeprazole (PRILOSEC) 20 MG capsule Take 20 mg by mouth daily.   Yes [provider]  ondansetron (ZOFRAN) 4 MG tablet Take 1 tablet (4 mg total) by mouth every 8 (eight) hours as needed for nausea or vomiting. 06/25/17  Yes Juanell Fairly, MD  oxyCODONE (OXY IR/ROXICODONE) 5 MG immediate release tablet Take 1 tablet (5 mg total) by mouth every 4 (four) hours as needed for severe pain. 06/25/17  Yes Juanell Fairly, MD  oxyCODONE (OXY IR/ROXICODONE) 5 MG immediate release tablet Take 1 tablet (5 mg total) by mouth every 4 (four) hours as needed for moderate pain ((score  4 to 6)). 06/27/17  Yes Juanell Fairly, MD  sitaGLIPtin (JANUVIA) 100 MG tablet Take 100 mg by mouth daily.   Yes [provider]  zolpidem (AMBIEN) 10 MG tablet Take 10 mg by mouth at bedtime.   Yes [provider]  azithromycin (ZITHROMAX Z-PAK) 250 MG tablet 2 tabs po once day 1, then 1 tab po qd for next 4 days 09/04/17   Payton Mccallum, MD  benzonatate (TESSALON) 200 MG capsule Take 1 capsule (200 mg total) by mouth 3 (three) times daily as needed. 09/04/17   Payton Mccallum, MD    Family History Family History  Problem Relation Age of Onset  . Diabetes Father     Social History Social History     Tobacco Use  . Smoking status: Never Smoker  . Smokeless tobacco: Never Used  Substance Use Topics  . Alcohol use: No  . Drug use: No     Allergies   Tizanidine; Ace inhibitors; Codeine; Lidoderm [lidocaine]; Naproxen; Niacin and related; Penicillins; Pravastatin; Prednisone; Simvastatin; and Tramadol   Review of Systems Review of Systems  HENT: Positive for congestion, rhinorrhea and sinus pain.   Respiratory: Positive for cough. Negative for wheezing.      Physical Exam Triage Vital Signs ED Triage Vitals  Enc Vitals Group     BP 09/04/17 1403 (!) 142/83     Pulse Rate 09/04/17 1403 83     Resp 09/04/17 1403 18     Temp 09/04/17 1403 98.8 F (37.1 C)     Temp Source 09/04/17 1403 Oral     SpO2 09/04/17 1403 99 %     Weight 09/04/17 1401 174 lb (78.9 kg)     Height 09/04/17 1401 5' (1.524 m)     Head Circumference --      Peak Flow --      Pain Score 09/04/17 1401 9     Pain Loc --      Pain Edu? --      Excl. in GC? --    No data found.  Updated Vital Signs BP (!) 142/83 (BP Location: Right Arm)   Pulse 83   Temp 98.8 F (37.1 C) (Oral)   Resp 18   Ht 5' (1.524 m)   Wt 174 lb (78.9 kg)   SpO2 99%   BMI 33.98 kg/m   Visual Acuity Right Eye Distance:   Left Eye Distance:   Bilateral Distance:    Right Eye Near:   Left Eye Near:    Bilateral Near:     Physical Exam  Constitutional: She appears well-developed and well-nourished. No distress.  HENT:  Head: Normocephalic and atraumatic.  Right Ear: Tympanic membrane, external ear and ear canal normal.  Left Ear: Tympanic membrane, external ear and ear canal normal.  Nose: Mucosal edema and rhinorrhea present. No nose lacerations, sinus tenderness, nasal deformity, septal deviation or nasal septal hematoma. No epistaxis.  No foreign bodies. Right sinus exhibits maxillary sinus tenderness and frontal sinus tenderness. Left sinus exhibits maxillary sinus tenderness and frontal sinus tenderness.   Mouth/Throat: Uvula is midline, oropharynx is clear and moist and mucous membranes are normal. No oropharyngeal exudate.  Eyes: Conjunctivae and EOM are normal. Pupils are equal, round, and reactive to light. Right eye exhibits no discharge. Left eye exhibits no discharge. No scleral icterus.  Neck: Normal range of motion. Neck supple. No thyromegaly present.  Cardiovascular: Normal rate, regular rhythm and normal heart sounds.  Pulmonary/Chest: Effort normal and breath sounds  normal. No respiratory distress. She has no wheezes. She has no rales.  Lymphadenopathy:    She has no cervical adenopathy.  Skin: She is not diaphoretic.  Nursing note and vitals reviewed.    UC Treatments / Results  Labs (all labs ordered are listed, but only abnormal results are displayed) Labs Reviewed - No data to display  EKG  EKG Interpretation None       Radiology No results found.  Procedures Procedures (including critical care time)  Medications Ordered in UC Medications - No data to display   Initial Impression / Assessment and Plan / UC Course  I have reviewed the triage vital signs and the nursing notes.  Pertinent labs & imaging results that were available during my care of the patient were reviewed by me and considered in my medical decision making (see chart for details).       Final Clinical Impressions(s) / UC Diagnoses   Final diagnoses:  Acute serous otitis media of left ear, recurrence not specified  Acute maxillary sinusitis, recurrence not specified    ED Discharge Orders        Ordered    azithromycin (ZITHROMAX Z-PAK) 250 MG tablet     09/04/17 1526    benzonatate (TESSALON) 200 MG capsule  3 times daily PRN     09/04/17 1526     1. diagnosis reviewed with patient 2. rx as per orders above; reviewed possible side effects, interactions, risks and benefits  3. Recommend supportive treatment with rest, otc analgesics prn 4. Follow-up prn if symptoms worsen or  don't improve  Controlled Substance Prescriptions  Controlled Substance Registry consulted? Not Applicable   Payton Mccallumonty, Oriyah Lamphear, MD 09/04/17 [phone]

## 2017-09-04 NOTE — ED Triage Notes (Signed)
Patient complains of body aches, cough, sinus pain and pressure, scratchy throat that started 2 days with a lot of mucus production.

## 2017-10-11 ENCOUNTER — Other Ambulatory Visit: Payer: Self-pay

## 2017-10-11 ENCOUNTER — Emergency Department: Payer: Medicare Other

## 2017-10-11 ENCOUNTER — Emergency Department
Admission: EM | Admit: 2017-10-11 | Discharge: 2017-10-11 | Disposition: A | Discharge status: Home / Self Care | Payer: Medicare Other | Attending: Student in an Organized Health Care Education/Training Program | Admitting: Student in an Organized Health Care Education/Training Program

## 2017-10-11 ENCOUNTER — Encounter: Payer: Self-pay | Admitting: Emergency Medicine

## 2017-10-11 DIAGNOSIS — S0003XA Contusion of scalp, initial encounter: Secondary | ICD-10-CM | POA: Insufficient documentation

## 2017-10-11 DIAGNOSIS — Z79899 Other long term (current) drug therapy: Secondary | ICD-10-CM | POA: Insufficient documentation

## 2017-10-11 DIAGNOSIS — Y929 Unspecified place or not applicable: Secondary | ICD-10-CM | POA: Diagnosis not present

## 2017-10-11 DIAGNOSIS — S40012A Contusion of left shoulder, initial encounter: Secondary | ICD-10-CM | POA: Insufficient documentation

## 2017-10-11 DIAGNOSIS — S40022A Contusion of left upper arm, initial encounter: Secondary | ICD-10-CM | POA: Diagnosis not present

## 2017-10-11 DIAGNOSIS — Z7984 Long term (current) use of oral hypoglycemic drugs: Secondary | ICD-10-CM | POA: Diagnosis not present

## 2017-10-11 DIAGNOSIS — Y939 Activity, unspecified: Secondary | ICD-10-CM | POA: Diagnosis not present

## 2017-10-11 DIAGNOSIS — S0990XA Unspecified injury of head, initial encounter: Secondary | ICD-10-CM | POA: Diagnosis present

## 2017-10-11 DIAGNOSIS — Z7982 Long term (current) use of aspirin: Secondary | ICD-10-CM | POA: Diagnosis not present

## 2017-10-11 DIAGNOSIS — Z23 Encounter for immunization: Secondary | ICD-10-CM | POA: Diagnosis not present

## 2017-10-11 DIAGNOSIS — Y990 Civilian activity done for income or pay: Secondary | ICD-10-CM | POA: Insufficient documentation

## 2017-10-11 DIAGNOSIS — S5012XA Contusion of left forearm, initial encounter: Secondary | ICD-10-CM | POA: Diagnosis not present

## 2017-10-11 DIAGNOSIS — I10 Essential (primary) hypertension: Secondary | ICD-10-CM | POA: Diagnosis not present

## 2017-10-11 DIAGNOSIS — E119 Type 2 diabetes mellitus without complications: Secondary | ICD-10-CM | POA: Insufficient documentation

## 2017-10-11 DIAGNOSIS — W19XXXA Unspecified fall, initial encounter: Secondary | ICD-10-CM

## 2017-10-11 MED ORDER — ONDANSETRON HCL 4 MG/2ML IJ SOLN
INTRAMUSCULAR | Status: AC
  Administered 2017-10-11: 4 mg via INTRAVENOUS
  Filled 2017-10-11: qty 2

## 2017-10-11 MED ORDER — HYDROMORPHONE HCL 1 MG/ML IJ SOLN
0.5000 mg | Freq: Once | INTRAMUSCULAR | Status: AC
  Administered 2017-10-11: 0.5 mg via INTRAVENOUS

## 2017-10-11 MED ORDER — HYDROMORPHONE HCL 1 MG/ML IJ SOLN
1.0000 mg | Freq: Once | INTRAMUSCULAR | Status: AC
  Administered 2017-10-11: 1 mg via INTRAVENOUS
  Filled 2017-10-11: qty 1

## 2017-10-11 MED ORDER — HYDROMORPHONE HCL 1 MG/ML IJ SOLN
INTRAMUSCULAR | Status: AC
  Administered 2017-10-11: 0.5 mg via INTRAVENOUS
  Filled 2017-10-11: qty 1

## 2017-10-11 MED ORDER — TETANUS-DIPHTH-ACELL PERTUSSIS 5-2.5-18.5 LF-MCG/0.5 IM SUSP
0.5000 mL | Freq: Once | INTRAMUSCULAR | Status: AC
  Administered 2017-10-11: 0.5 mL via INTRAMUSCULAR
  Filled 2017-10-11: qty 0.5

## 2017-10-11 MED ORDER — OXYCODONE-ACETAMINOPHEN 5-325 MG PO TABS
1.0000 | ORAL_TABLET | Freq: Once | ORAL | Status: AC
  Administered 2017-10-11: 1 via ORAL

## 2017-10-11 MED ORDER — ONDANSETRON HCL 4 MG/2ML IJ SOLN
4.0000 mg | Freq: Once | INTRAMUSCULAR | Status: AC
  Administered 2017-10-11: 4 mg via INTRAVENOUS

## 2017-10-11 MED ORDER — OXYCODONE-ACETAMINOPHEN 5-325 MG PO TABS
ORAL_TABLET | ORAL | Status: AC
  Filled 2017-10-11: qty 1
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
## 2017-10-11 NOTE — Discharge Instructions (Signed)
Ice and elevate your arm frequently to help control pain.  Contact Dr. Jones BalesKrzeminski and make an appointment for follow-up of your left shoulder pain.  You have soreness and stiffness for the next 4 to 5 days.  Wear sling as needed for support.  Continue taking your pain medication at home as your doctor in [place] has prescribed.

## 2017-10-11 NOTE — ED Notes (Signed)
Patient transported to CT 

## 2017-10-11 NOTE — ED Notes (Signed)
Ice pack placed on patient's left shoulder.
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
## 2017-10-11 NOTE — ED Notes (Signed)
White ring placed in zipper portion of patient's wallet with patient's knowledge by [place].

## 2017-10-11 NOTE — ED Notes (Signed)
Belongings bag given to patient where items were placed inside including: wallet, cell phone, keys, discharge instructions, and container with two white ear rings inside.  Bag taken to car by sister who was patient's ride home.

## 2018-02-15 ENCOUNTER — Other Ambulatory Visit: Payer: Self-pay

## 2018-02-15 ENCOUNTER — Emergency Department: Payer: Medicare Other

## 2018-02-15 ENCOUNTER — Emergency Department
Admission: EM | Admit: 2018-02-15 | Discharge: 2018-02-16 | Disposition: A | Discharge status: Home / Self Care | Payer: Medicare Other | Attending: Emergency Medicine | Admitting: Emergency Medicine

## 2018-02-15 DIAGNOSIS — R519 Headache, unspecified: Secondary | ICD-10-CM

## 2018-02-15 DIAGNOSIS — R42 Dizziness and giddiness: Secondary | ICD-10-CM | POA: Insufficient documentation

## 2018-02-15 DIAGNOSIS — Z7982 Long term (current) use of aspirin: Secondary | ICD-10-CM | POA: Insufficient documentation

## 2018-02-15 DIAGNOSIS — Z79899 Other long term (current) drug therapy: Secondary | ICD-10-CM | POA: Insufficient documentation

## 2018-02-15 DIAGNOSIS — I10 Essential (primary) hypertension: Secondary | ICD-10-CM | POA: Diagnosis not present

## 2018-02-15 DIAGNOSIS — R112 Nausea with vomiting, unspecified: Secondary | ICD-10-CM | POA: Diagnosis not present

## 2018-02-15 DIAGNOSIS — E119 Type 2 diabetes mellitus without complications: Secondary | ICD-10-CM | POA: Insufficient documentation

## 2018-02-15 DIAGNOSIS — R51 Headache: Secondary | ICD-10-CM | POA: Insufficient documentation

## 2018-02-15 LAB — CBC WITH DIFFERENTIAL/PLATELET
BASOS ABS: 0.1 10*3/uL (ref 0–0.1)
BASOS PCT: 1 %
EOS ABS: 0.1 10*3/uL (ref 0–0.7)
EOS PCT: 1 %
HCT: 38.2 % (ref 35.0–47.0)
HEMOGLOBIN: 12.3 g/dL (ref 12.0–16.0)
LYMPHS ABS: 3.3 10*3/uL (ref 1.0–3.6)
Lymphocytes Relative: 26 %
MCH: 28 pg (ref 26.0–34.0)
MCHC: 32.1 g/dL (ref 32.0–36.0)
MCV: 87.1 fL (ref 80.0–100.0)
Monocytes Absolute: 0.9 10*3/uL (ref 0.2–0.9)
Monocytes Relative: 7 %
NEUTROS PCT: 65 %
Neutro Abs: 8.5 10*3/uL — ABNORMAL HIGH (ref 1.4–6.5)
PLATELETS: 437 10*3/uL (ref 150–440)
RBC: 4.39 MIL/uL (ref 3.80–5.20)
RDW: 16.7 % — ABNORMAL HIGH (ref 11.5–14.5)
WBC: 12.9 10*3/uL — AB (ref 3.6–11.0)

## 2018-02-15 LAB — URINALYSIS, COMPLETE (UACMP) WITH MICROSCOPIC
BILIRUBIN URINE: NEGATIVE
Glucose, UA: >=500 mg/dL — AB
HGB URINE DIPSTICK: NEGATIVE
KETONES UR: NEGATIVE mg/dL
LEUKOCYTES UA: NEGATIVE
NITRITE: NEGATIVE
PH: 5 (ref 5.0–8.0)
Protein, ur: 30 mg/dL — AB
SPECIFIC GRAVITY, URINE: 1.023 (ref 1.005–1.030)

## 2018-02-15 LAB — COMPREHENSIVE METABOLIC PANEL
ALBUMIN: 4.3 g/dL (ref 3.5–5.0)
ALT: 29 U/L (ref 0–44)
ANION GAP: 10 (ref 5–15)
AST: 31 U/L (ref 15–41)
Alkaline Phosphatase: 77 U/L (ref 38–126)
BILIRUBIN TOTAL: 0.4 mg/dL (ref 0.3–1.2)
BUN: 14 mg/dL (ref 8–23)
CHLORIDE: 98 mmol/L (ref 98–111)
CO2: 28 mmol/L (ref 22–32)
Calcium: 9.5 mg/dL (ref 8.9–10.3)
Creatinine, Ser: 0.77 mg/dL (ref 0.44–1.00)
GFR calc Af Amer: >60 mL/min (ref >60)
Glucose, Bld: 267 mg/dL — ABNORMAL HIGH (ref 70–99)
POTASSIUM: 3.3 mmol/L — AB (ref 3.5–5.1)
SODIUM: 136 mmol/L (ref 135–145)
TOTAL PROTEIN: 7.8 g/dL (ref 6.5–8.1)

## 2018-02-15 LAB — LIPASE, BLOOD: LIPASE: 36 U/L (ref 11–51)

## 2018-02-15 MED ORDER — MECLIZINE HCL 25 MG PO TABS: 25.0000 mg | ORAL_TABLET | Freq: Three times a day (TID) | ORAL | 0 refills | Status: AC | PRN

## 2018-02-15 MED ORDER — MECLIZINE HCL 25 MG PO TABS
25.0000 mg | ORAL_TABLET | Freq: Once | ORAL | Status: AC
  Administered 2018-02-16: 25 mg via ORAL
  Filled 2018-02-15: qty 1

## 2018-02-15 MED ORDER — SODIUM CHLORIDE 0.9 % IV BOLUS
1000.0000 mL | Freq: Once | INTRAVENOUS | Status: AC
  Administered 2018-02-15: 1000 mL via INTRAVENOUS

## 2018-02-15 MED ORDER — ONDANSETRON HCL 4 MG/2ML IJ SOLN
4.0000 mg | Freq: Once | INTRAMUSCULAR | Status: AC
  Administered 2018-02-15: 4 mg via INTRAVENOUS
  Filled 2018-02-15: qty 2

## 2018-02-15 MED ORDER — ACETAMINOPHEN 500 MG PO TABS
1000.0000 mg | ORAL_TABLET | Freq: Once | ORAL | Status: AC
  Administered 2018-02-15: 1000 mg via ORAL
  Filled 2018-02-15: qty 2

## 2018-02-15 MED ORDER — DIAZEPAM 5 MG/ML IJ SOLN
2.5000 mg | Freq: Once | INTRAMUSCULAR | Status: AC
  Administered 2018-02-15: 2.5 mg via INTRAVENOUS
  Filled 2018-02-15 (×2): qty 2

## 2018-02-15 NOTE — ED Triage Notes (Signed)
Reports dizziness for 2-3 weeks.  Reports seen by her MD and told possible virus.  Reports symptoms seemed better for day or so but have returned.

## 2018-02-15 NOTE — ED Provider Notes (Signed)
[facility] Emergency Department Provider Note ____________________________________________   First MD Initiated Contact with Patient 02/15/18 2123     (approximate)  I have reviewed the triage vital signs and the nursing notes.   HISTORY  Chief Complaint Dizziness  HPI Courtney [name] is a 62 y.o. female of anxiety and depression as well as vertigo who was presented to the emergency department today with several months of vertigo associated with nausea and vomiting.  She says that she had previously been on meclizine and it was working but she no longer has this medication.  She is also describing a 9 out of 10 frontal throbbing headache at this time.  Says she is come to the emergency department today because she is sick of the symptoms and "could not take it anymore."  Says that she also intimately has pressure in her ears and nasal drainage.  Does not report any ringing in her ears.  Says that the vertigo is worse with looking side to side.  No reports of weakness or numbness.  Past Medical History:  Diagnosis Date  . Anxiety and depression   . Chronic back pain   . Diabetes mellitus without complication (HCC)   . GERD (gastroesophageal reflux disease)   . Hyperlipidemia   . Hypertension   . Insomnia   . Obesity   . Sleep apnea   . Trochanteric bursitis     Patient Active Problem List   Diagnosis Date Noted  . S/P rotator cuff surgery 06/25/2017  . Bad headache 01/15/2016    Past Surgical History:  Procedure Laterality Date  . BACK SURGERY    . HEMORROIDECTOMY    . LUMBAR DISC SURGERY  1999  . right shoulder surgery      rotator cuff repair  . SHOULDER ARTHROSCOPY WITH OPEN ROTATOR CUFF REPAIR Left 06/25/2017   Procedure: SHOULDER ARTHROSCOPY WITH OPEN ROTATOR CUFF REPAIR,DISTAL CLAVICLE EXCISION, & SUBACROMINAL DECOMPRESSION;  Surgeon: Juanell Fairly, MD;  Location: [facility] ORS;  Service: Orthopedics;  Laterality: Left;    Prior to Admission  medications   Medication Sig Start Date End Date Taking? Authorizing Provider  aspirin EC 81 MG tablet Take 81 mg by mouth every other day.    [provider]  cholecalciferol (VITAMIN D) 1000 units tablet Take 2,000 Units by mouth daily.     [provider]  clonazePAM (KLONOPIN) 1 MG tablet Take 1 mg by mouth 2 (two) times daily as needed for anxiety.     [provider]  cyclobenzaprine (FLEXERIL) 5 MG tablet Take 5 mg by mouth 3 (three) times daily as needed for muscle spasms.    [provider]  fluticasone (FLONASE) 50 MCG/ACT nasal spray Place 2 sprays into both nostrils daily.    [provider]  glipiZIDE-metformin (METAGLIP) 5-500 MG tablet Take 2 tablets by mouth 2 (two) times daily before a meal.    [provider]  loratadine (CLARITIN) 10 MG tablet Take 10 mg by mouth daily.    [provider]  olmesartan-hydrochlorothiazide (BENICAR HCT) 40-12.5 MG tablet Take 1 tablet by mouth daily.    [provider]  omeprazole (PRILOSEC) 20 MG capsule Take 20 mg by mouth daily.    [provider]  sitaGLIPtin (JANUVIA) 100 MG tablet Take 100 mg by mouth daily.    [provider]  zolpidem (AMBIEN) 10 MG tablet Take 10 mg by mouth at bedtime.    [provider]    Allergies Tizanidine; Ace  inhibitors; Codeine; Lidoderm [lidocaine]; Naproxen; Niacin and related; Penicillins; Pravastatin; Prednisone; Simvastatin; and Tramadol  Family History  Problem Relation Age of Onset  . Diabetes Father     Social History Social History   Tobacco Use  . Smoking status: Never Smoker  . Smokeless tobacco: Never Used  Substance Use Topics  . Alcohol use: No  . Drug use: No    Review of Systems  Constitutional: No fever/chills Eyes: No visual changes. ENT: No sore throat. Cardiovascular: Denies chest pain. Respiratory: Denies shortness of breath. Gastrointestinal: No abdominal pain.   No  diarrhea.  No constipation. Genitourinary: Negative for dysuria. Musculoskeletal: Negative for back pain. Skin: Negative for rash. Neurological: Negative for focal weakness or numbness.   ____________________________________________   PHYSICAL EXAM:  VITAL SIGNS: ED Triage Vitals  Enc Vitals Group     BP 02/15/18 2122 (!) 148/72     Pulse Rate 02/15/18 2122 74     Resp 02/15/18 2122 17     Temp 02/15/18 2122 97.7 F (36.5 C)     Temp Source 02/15/18 2122 Oral     SpO2 02/15/18 2122 97 %     Weight 02/15/18 2119 173 lb (78.5 kg)     Height 02/15/18 2119 5' (1.524 m)     Head Circumference --      Peak Flow --      Pain Score 02/15/18 2119 0     Pain Loc --      Pain Edu? --      Excl. in GC? --     Constitutional: Alert and oriented.  No acute distress. Eyes: Conjunctivae are normal.  Head: Atraumatic.  Normal TMs bilaterally. Nose: No congestion/rhinnorhea. Mouth/Throat: Mucous membranes are moist.  Neck: No stridor.   Cardiovascular: Normal rate, regular rhythm. Grossly normal heart sounds.  Respiratory: Normal respiratory effort.  No retractions. Lungs CTAB. Gastrointestinal: Soft and nontender. No distention.  Musculoskeletal: No lower extremity tenderness nor edema.  No joint effusions. Neurologic:  Normal speech and language. No gross focal neurologic deficits are appreciated.  No nystagmus.  No ataxia on finger-to-nose testing.  No ataxia on heel-to-shin testing. Skin:  Skin is warm, dry and intact. No rash noted. Psychiatric: Mood and affect are normal. Speech and behavior are normal.  ____________________________________________   LABS (all labs ordered are listed, but only abnormal results are displayed)  Labs Reviewed  CBC WITH DIFFERENTIAL/PLATELET - Abnormal; Notable for the following components:      Result Value   WBC 12.9 (*)    RDW 16.7 (*)    Neutro Abs 8.5 (*)    All other components within normal limits  COMPREHENSIVE METABOLIC PANEL -  Abnormal; Notable for the following components:   Potassium 3.3 (*)    Glucose, Bld 267 (*)    All other components within normal limits  URINALYSIS, COMPLETE (UACMP) WITH MICROSCOPIC - Abnormal; Notable for the following components:   Color, Urine YELLOW (*)    APPearance HAZY (*)    Glucose, UA >=500 (*)    Protein, ur 30 (*)    Bacteria, UA RARE (*)    All other components within normal limits  LIPASE, BLOOD   ____________________________________________  EKG  ED ECG REPORT I, Arelia Longest, the attending physician, personally viewed and interpreted this ECG.   Date: 02/15/2018  EKG Time: 2116  Rate: 78  Rhythm: normal sinus rhythm  Axis: Normal  Intervals:none  ST&T Change: No ST segment elevation or depression.  No abnormal  T wave inversion.  ____________________________________________  RADIOLOGY  Head CT without any acute process. ____________________________________________   PROCEDURES  Procedure(s) performed:   Procedures  Critical Care performed:   ____________________________________________   INITIAL IMPRESSION / ASSESSMENT AND PLAN / ED COURSE  Pertinent labs & imaging results that were available during my care of the patient were reviewed by me and considered in my medical decision making (see chart for details).  DDX: Vertigo, labyrinthitis, peripheral vertigo, central vertigo, less light abnormality, UTI, nausea and vomiting As part of my medical decision making, I reviewed the following data within the electronic MEDICAL RECORD NUMBER Notes from prior ED visits  ----------------------------------------- 11:35 PM on 02/15/2018 -----------------------------------------  Patient at this time says that her vertigo is resolved with Valium.  Says that she is also feeling like her mouth was dry previously and after fluids and no longer feels dry.  Headache is decreased now she has been 8 out of 10 but is much more tolerable.  No vomiting in the  emergency department.  Patient to follow-up with your nose and throat.  She is understanding of the treatment plan as well as diagnosis and willing to comply.  No objective finding on exam of central vertigo.  Reassuring CT as well.  Nausea vomiting possibly secondary to the vertigo.  Reassuring abdominal exam.  Chronically elevated white blood cell count. ____________________________________________   FINAL CLINICAL IMPRESSION(S) / ED DIAGNOSES  Vertigo.  Nausea and vomiting.  NEW MEDICATIONS STARTED DURING THIS VISIT:  New Prescriptions   No medications on file     Note:  This document was prepared using Dragon voice recognition software and may include unintentional dictation errors.     Myrna BlazerSchaevitz, Damani Kelemen Matthew, MD 02/15/18 [phone]

## 2018-02-15 NOTE — ED Notes (Signed)
ED Provider at bedside. 

## 2018-02-16 DIAGNOSIS — R42 Dizziness and giddiness: Secondary | ICD-10-CM | POA: Diagnosis not present

## 2018-02-16 NOTE — ED Notes (Signed)
Pt ambulatory to wheel chair upon discharge. Verbalized understanding of discharge instructions, follow-up care and prescription. VSS. Skin warm and dry. A&O x4. Sister out front to pick pt up. This RN to wheel pt out.

## 2018-05-09 IMAGING — DX DG CHEST 1V PORT
1 series · 1 of 1 positions shown · non-contrast
Comparison: None.

CLINICAL DATA: Chest pain.

EXAM:
PORTABLE CHEST 1 VIEW

[chest ap]
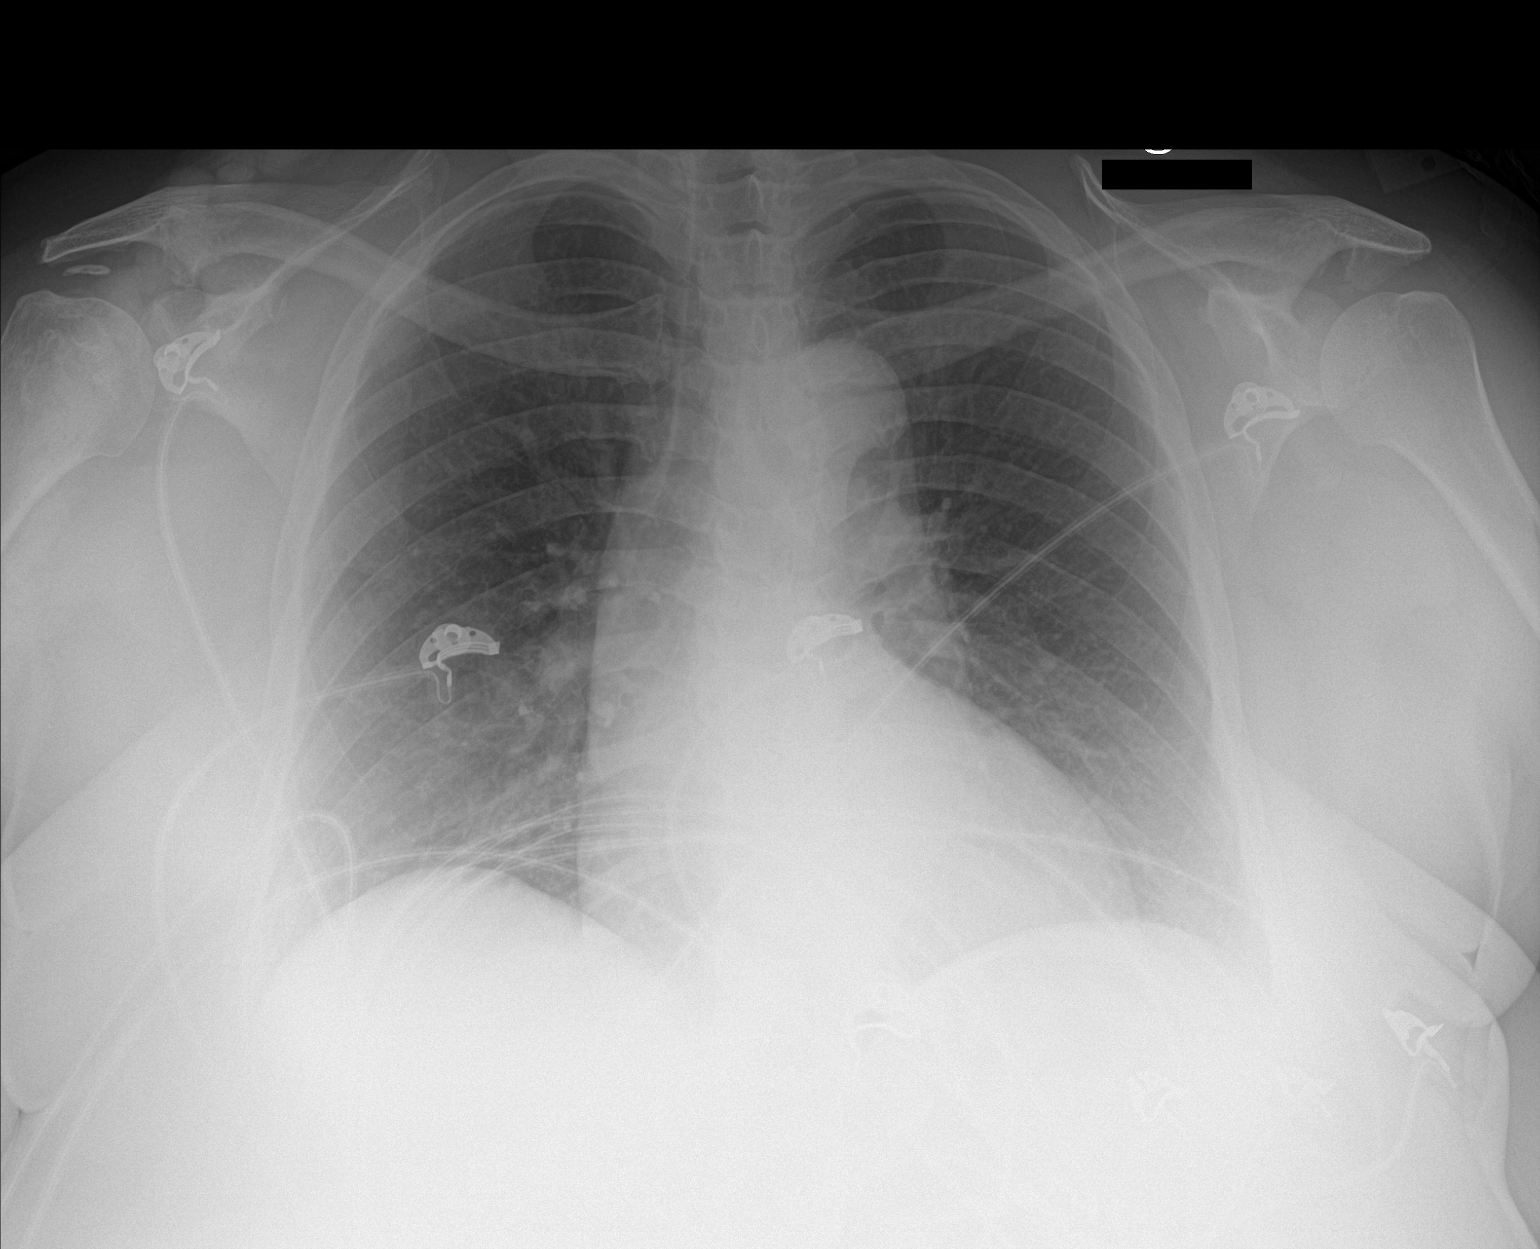

[1 of 1 positions shown; findings below may reference images not displayed]

FINDINGS: Both lungs are clear. Negative for a pneumothorax. Heart and
mediastinum are within normal limits. The trachea is midline. No
acute bone abnormality.
IMPRESSION: No active disease.

## 2018-06-30 ENCOUNTER — Encounter: Payer: Self-pay | Admitting: Emergency Medicine

## 2018-06-30 ENCOUNTER — Other Ambulatory Visit: Payer: Self-pay

## 2018-06-30 ENCOUNTER — Emergency Department
Admission: EM | Admit: 2018-06-30 | Discharge: 2018-06-30 | Disposition: A | Discharge status: Home / Self Care | Payer: Medicare HMO | Attending: Emergency Medicine | Admitting: Emergency Medicine

## 2018-06-30 ENCOUNTER — Emergency Department: Payer: Medicare HMO

## 2018-06-30 DIAGNOSIS — Z79899 Other long term (current) drug therapy: Secondary | ICD-10-CM | POA: Diagnosis not present

## 2018-06-30 DIAGNOSIS — Z7982 Long term (current) use of aspirin: Secondary | ICD-10-CM | POA: Diagnosis not present

## 2018-06-30 DIAGNOSIS — M25521 Pain in right elbow: Secondary | ICD-10-CM | POA: Insufficient documentation

## 2018-06-30 DIAGNOSIS — Z7984 Long term (current) use of oral hypoglycemic drugs: Secondary | ICD-10-CM | POA: Diagnosis not present

## 2018-06-30 DIAGNOSIS — E119 Type 2 diabetes mellitus without complications: Secondary | ICD-10-CM | POA: Insufficient documentation

## 2018-06-30 DIAGNOSIS — X509XXA Other and unspecified overexertion or strenuous movements or postures, initial encounter: Secondary | ICD-10-CM | POA: Diagnosis not present

## 2018-06-30 DIAGNOSIS — I10 Essential (primary) hypertension: Secondary | ICD-10-CM | POA: Diagnosis not present

## 2018-06-30 DIAGNOSIS — S46211A Strain of muscle, fascia and tendon of other parts of biceps, right arm, initial encounter: Secondary | ICD-10-CM | POA: Diagnosis not present

## 2018-06-30 DIAGNOSIS — Y92812 Truck as the place of occurrence of the external cause: Secondary | ICD-10-CM | POA: Diagnosis not present

## 2018-06-30 DIAGNOSIS — M79601 Pain in right arm: Secondary | ICD-10-CM

## 2018-06-30 DIAGNOSIS — Y9389 Activity, other specified: Secondary | ICD-10-CM | POA: Insufficient documentation

## 2018-06-30 DIAGNOSIS — Y999 Unspecified external cause status: Secondary | ICD-10-CM | POA: Diagnosis not present

## 2018-06-30 DIAGNOSIS — S4991XA Unspecified injury of right shoulder and upper arm, initial encounter: Secondary | ICD-10-CM | POA: Diagnosis present

## 2018-06-30 MED ORDER — CYCLOBENZAPRINE HCL 5 MG PO TABS: ORAL_TABLET | ORAL | 0 refills | Status: AC

## 2018-06-30 MED ORDER — OXYCODONE-ACETAMINOPHEN 5-325 MG PO TABS
1.0000 | ORAL_TABLET | Freq: Once | ORAL | Status: AC
  Administered 2018-06-30: 1 via ORAL
  Filled 2018-06-30: qty 1

## 2018-06-30 MED ORDER — ORPHENADRINE CITRATE 30 MG/ML IJ SOLN
60.0000 mg | Freq: Two times a day (BID) | INTRAMUSCULAR | Status: DC
  Administered 2018-06-30: 60 mg via INTRAMUSCULAR
  Filled 2018-06-30: qty 2

## 2018-06-30 MED ORDER — OXYCODONE HCL 5 MG PO TABS
2.5000 mg | ORAL_TABLET | Freq: Once | ORAL | Status: AC
  Administered 2018-06-30: 2.5 mg via ORAL
  Filled 2018-06-30: qty 1

## 2018-06-30 NOTE — ED Provider Notes (Signed)
[facility] Emergency Department Provider Note  ____________________________________________  Time seen: Approximately 12:59 PM  I have reviewed the triage vital signs and the nursing notes.   HISTORY  Chief Complaint Elbow Pain    HPI Courtney [name] is a 63 y.o. female that presents to the emergency department for evaluation of right elbow and upper arm pain after injury yesterday.  Patient states that she was trying to get out of the truck and raising her right arm which bent backwards.  She is primarily having pain over her flexor elbow.  She has seen Dr. Jones Bales in the past.  No numbness, tingling.   Past Medical History:  Diagnosis Date  . Anxiety and depression   . Chronic back pain   . Diabetes mellitus without complication (HCC)   . GERD (gastroesophageal reflux disease)   . Hyperlipidemia   . Hypertension   . Insomnia   . Obesity   . Sleep apnea   . Trochanteric bursitis     Patient Active Problem List   Diagnosis Date Noted  . S/P rotator cuff surgery 06/25/2017  . Bad headache 01/15/2016    Past Surgical History:  Procedure Laterality Date  . BACK SURGERY    . HEMORROIDECTOMY    . LUMBAR DISC SURGERY  1999  . right shoulder surgery      rotator cuff repair  . SHOULDER ARTHROSCOPY WITH OPEN ROTATOR CUFF REPAIR Left 06/25/2017   Procedure: SHOULDER ARTHROSCOPY WITH OPEN ROTATOR CUFF REPAIR,DISTAL CLAVICLE EXCISION, & SUBACROMINAL DECOMPRESSION;  Surgeon: Juanell Fairly, MD;  Location: [facility] ORS;  Service: Orthopedics;  Laterality: Left;    Prior to Admission medications   Medication Sig Start Date End Date Taking? Authorizing Provider  aspirin EC 81 MG tablet Take 81 mg by mouth every other day.    [provider]  cholecalciferol (VITAMIN D) 1000 units tablet Take 2,000 Units by mouth daily.     [provider]  clonazePAM (KLONOPIN) 1 MG tablet Take 1 mg by mouth 2 (two) times daily as needed for anxiety.      [provider]  cyclobenzaprine (FLEXERIL) 5 MG tablet Take 1-2 tablets 3 times daily as needed 06/30/18   Enid Derry, PA-C  fluticasone [facility]) 50 MCG/ACT nasal spray Place 2 sprays into both nostrils daily.    [provider]  glipiZIDE-metformin (METAGLIP) 5-500 MG tablet Take 2 tablets by mouth 2 (two) times daily before a meal.    [provider]  loratadine (CLARITIN) 10 MG tablet Take 10 mg by mouth daily.    [provider]  meclizine (ANTIVERT) 25 MG tablet Take 1 tablet (25 mg total) by mouth 3 (three) times daily as needed for dizziness or nausea. 02/15/18   Myrna Blazer, MD  olmesartan-hydrochlorothiazide (BENICAR HCT) 40-12.5 MG tablet Take 1 tablet by mouth daily.    [provider]  omeprazole (PRILOSEC) 20 MG capsule Take 20 mg by mouth daily.    [provider]  sitaGLIPtin (JANUVIA) 100 MG tablet Take 100 mg by mouth daily.    [provider]  zolpidem (AMBIEN) 10 MG tablet Take 10 mg by mouth at bedtime.    [provider]    Allergies Tizanidine; Ace inhibitors; Codeine; Lidoderm [lidocaine]; Naproxen; Niacin and related; Penicillins; Pravastatin; Prednisone; Simvastatin; and Tramadol  Family History  Problem Relation Age of Onset  . Diabetes Father     Social History Social History   Tobacco Use  . Smoking status: Never Smoker  .  Smokeless tobacco: Never Used  Substance Use Topics  . Alcohol use: No  . Drug use: No     Review of Systems  Gastrointestinal:  No nausea, no vomiting.  Musculoskeletal: Positive for arm pain. Skin: Negative for rash, abrasions, lacerations, ecchymosis. Neurological: Negative for numbness or tingling   ____________________________________________   PHYSICAL EXAM:  VITAL SIGNS: ED Triage Vitals  Enc Vitals Group     BP 06/30/18 1120 124/85     Pulse Rate 06/30/18 1120 81     Resp 06/30/18 1120 18     Temp 06/30/18 1120 98.6 F (37  C)     Temp Source 06/30/18 1120 Oral     SpO2 06/30/18 1120 99 %     Weight 06/30/18 1121 173 lb (78.5 kg)     Height 06/30/18 1120 5' (1.524 m)     Head Circumference --      Peak Flow --      Pain Score 06/30/18 1120 10     Pain Loc --      Pain Edu? --      Excl. in GC? --      Constitutional: Alert and oriented. Well appearing and in no acute distress. Eyes: Conjunctivae are normal. PERRL. EOMI. Head: Atraumatic. ENT:      Ears:      Nose: No congestion/rhinnorhea.      Mouth/Throat: Mucous membranes are moist.  Neck: No stridor.  Cardiovascular: Normal rate, regular rhythm.  Good peripheral circulation. Respiratory: Normal respiratory effort without tachypnea or retractions. Lungs CTAB. Good air entry to the bases with no decreased or absent breath sounds. Musculoskeletal: Full range of motion to all extremities. No gross deformities appreciated.  Full range of motion of right elbow but with pain.  Tenderness to palpation over distal biceps.  Patient able to perform resisted flexion of right elbow.  Patient able to flex biceps but with pain.   No palpable bulge.  Patient used her right arm to reach up and adjust her gown on her left shoulder without seemingly difficulty. Neurologic:  Normal speech and language. No gross focal neurologic deficits are appreciated.  Skin:  Skin is warm, dry and intact. No rash noted. Psychiatric: Mood and affect are normal. Speech and behavior are normal. Patient exhibits appropriate insight and judgement.   ____________________________________________   LABS (all labs ordered are listed, but only abnormal results are displayed)  Labs Reviewed - No data to display ____________________________________________  EKG   ____________________________________________  RADIOLOGY Lexine BatonI, Lillyonna Armstead, personally viewed and evaluated these images (plain radiographs) as part of my medical decision making, as well as reviewing the written report by the  radiologist.  Dg Elbow Complete Right  Result Date: 06/30/2018 CLINICAL DATA:  Patient fell last evening now with right elbow pain. EXAM: RIGHT ELBOW - COMPLETE 3+ VIEW COMPARISON:  None. FINDINGS: No fracture or elbow joint effusion. Joint spaces appear preserved. Regional soft tissues appear normal. No radiopaque foreign body. IMPRESSION: No explanation for patient's right elbow pain. Specifically, no fracture or elbow joint effusion. Electronically Signed   By: Simonne ComeJohn  Watts M.D.   On: 06/30/2018 12:12    ____________________________________________    PROCEDURES  Procedure(s) performed:    Procedures    Medications  orphenadrine (NORFLEX) injection 60 mg (60 mg Intramuscular Given 06/30/18 1331)  oxyCODONE-acetaminophen (PERCOCET/ROXICET) 5-325 MG per tablet 1 tablet (1 tablet Oral Given 06/30/18 1245)  oxyCODONE (Oxy IR/ROXICODONE) immediate release tablet 2.5 mg (2.5 mg Oral Given 06/30/18 1423)  ____________________________________________   INITIAL IMPRESSION / ASSESSMENT AND PLAN / ED COURSE  Pertinent labs & imaging results that were available during my care of the patient were reviewed by me and considered in my medical decision making (see chart for details).  Review of the [place] CSRS was performed in accordance of the [facility] prior to dispensing any controlled drugs.   Patient's diagnosis is consistent with biceps strain or tear.  Vital signs and exam are reassuring.  X-ray negative for acute bony abnormalities.  Exam is consistent with a strain or tear.  Patient is still able to perform resisted flexion of elbow and able to flex her biceps.  Complete biceps rupture is less likely.  Patient takes hydrocodone at home for chronic pain and will continue with medication.  She sees Dr. Real ConsKrazinski and would like to follow-up with him.  She was given a sling.  Pain improved with Percocet and Norflex.  Patient will be discharged home with prescriptions for Flexeril. Patient is to  follow up with Ortho as directed. Patient is given ED precautions to return to the ED for any worsening or new symptoms.     ____________________________________________  FINAL CLINICAL IMPRESSION(S) / ED DIAGNOSES  Final diagnoses:  Strain of right biceps muscle, initial encounter  Pain of right upper extremity      NEW MEDICATIONS STARTED DURING THIS VISIT:  ED Discharge Orders         Ordered    cyclobenzaprine (FLEXERIL) 5 MG tablet     06/30/18 1408              This chart was dictated using voice recognition software/Dragon. Despite best efforts to proofread, errors can occur which can change the meaning. Any change was purely unintentional.    Enid DerryWagner, Fatimata Talsma, PA-C 06/30/18 1519    Arnaldo NatalMalinda, Paul F, MD 06/30/18 [phone]

## 2018-06-30 NOTE — Discharge Instructions (Addendum)
Your elbow x-ray is negative for any fractures.  I am concerned that you have torn your biceps muscle.  Please apply ice to area tonight.  You can take your hydrocodone for pain.  I have also given you a prescription for a muscle relaxer.  Please call Dr. Real Cons for an appointment as soon as possible.

## 2018-06-30 NOTE — ED Triage Notes (Signed)
States fell last night and twisted R arm back, painful since.

## 2018-06-30 NOTE — ED Notes (Signed)
See triage note - right elbow swollen, painful, cool to touch, +2 radial pulses.

## 2018-09-09 IMAGING — CT CT HIP*R* W/O CM
2 of 3 series · 17 of 46 positions shown, 19 images · non-contrast
Comparison: Radiographs 08/06/2016 and 01/11/2014.

CLINICAL DATA: Right hip pain since yesterday. Patient reports
[employer] while walking and has limited weight-bearing
since. No known fall or relevant surgical history.

EXAM:
CT OF THE RIGHT HIP WITHOUT CONTRAST
TECHNIQUE: Multidetector CT imaging of the right hip was performed according to
the standard protocol. Multiplanar CT image reconstructions were
also generated.

[Series 4: axial st · axial · 0.49mm/px · z∈[-625,-445]mm · 14 of 104 slices shown, 16 images]
[im 7/104  soft-tissue]
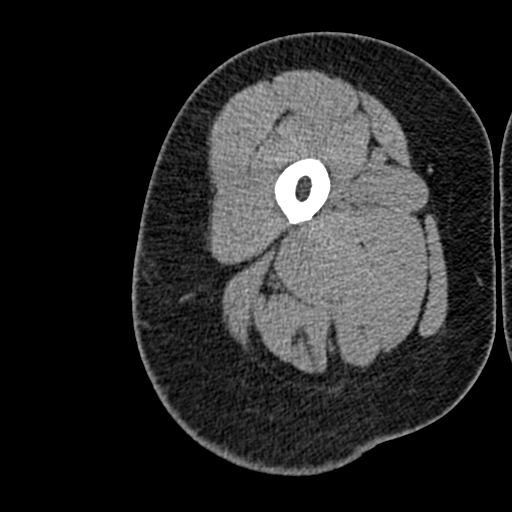
[im 7/104  bone]
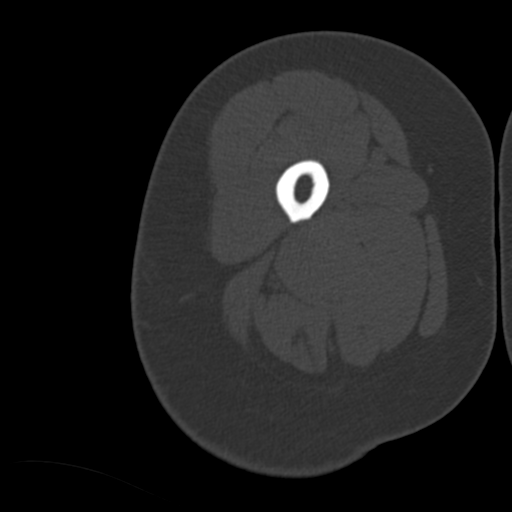
[im 14/104  soft-tissue]
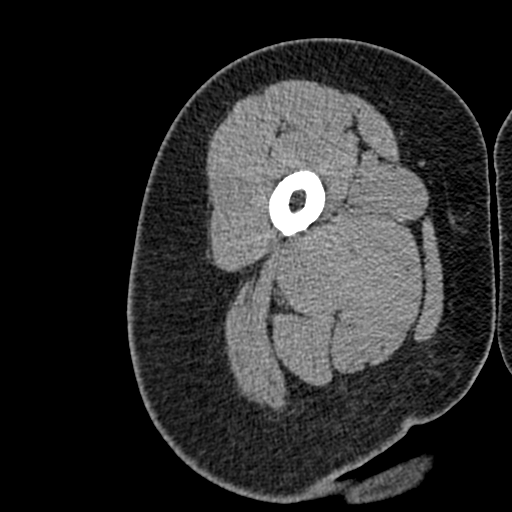
[im 20/104  soft-tissue]
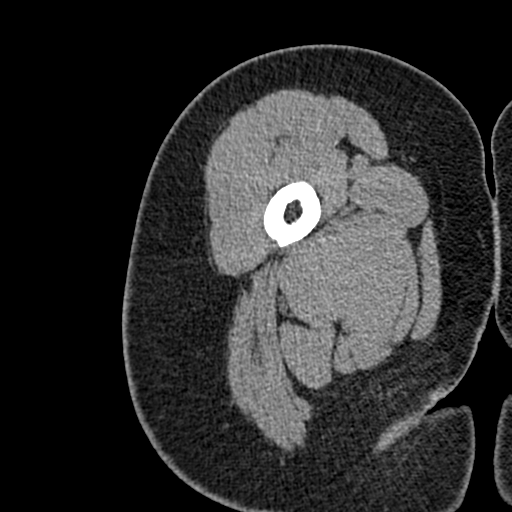
[im 27/104  soft-tissue]
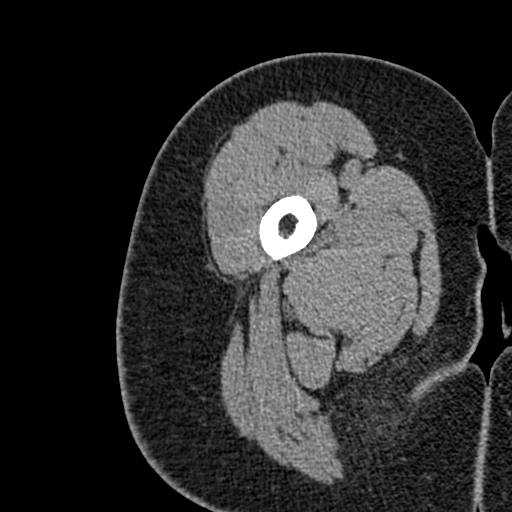
[im 34/104  soft-tissue]
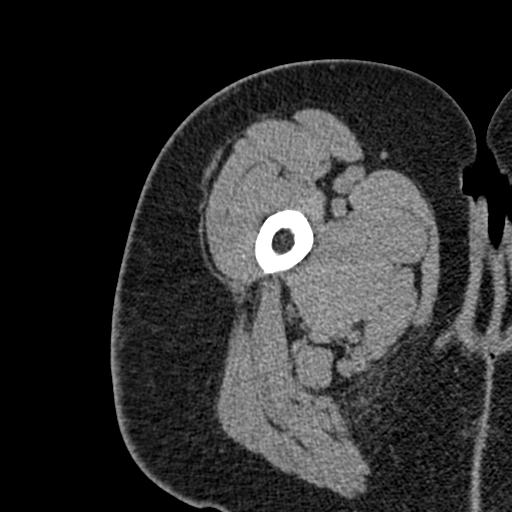
[im 40/104  soft-tissue]
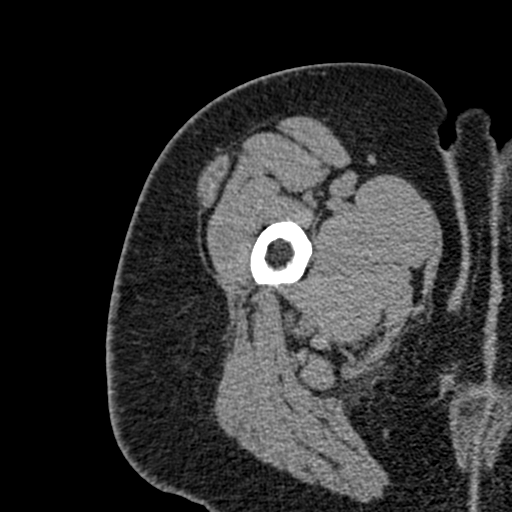
[im 47/104  soft-tissue]
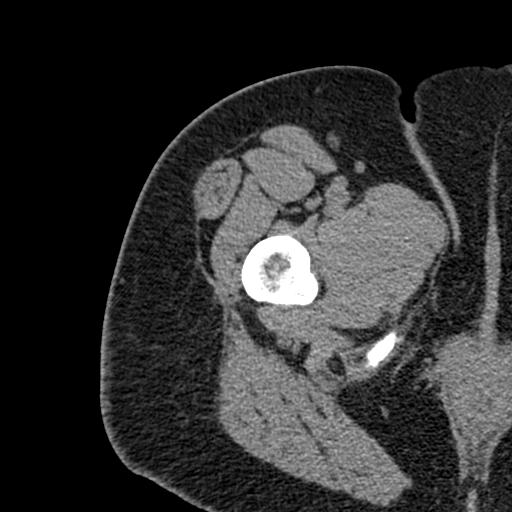
[im 57/104  soft-tissue]
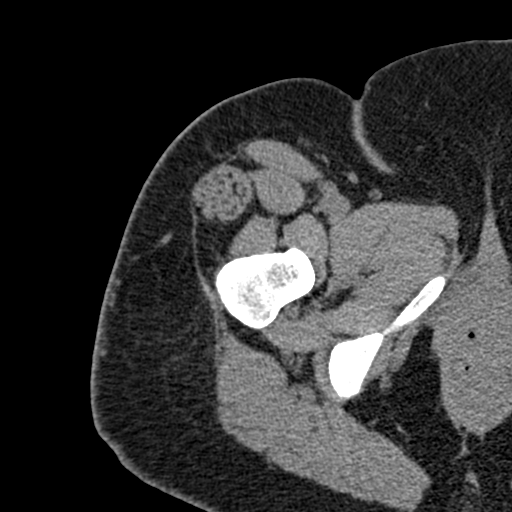
[im 64/104  soft-tissue]
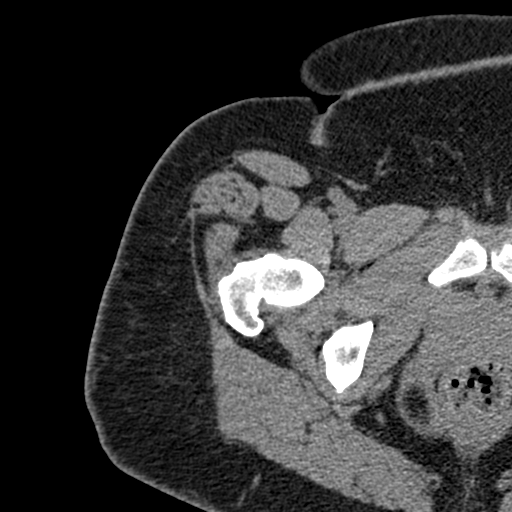
[im 64/104  bone]
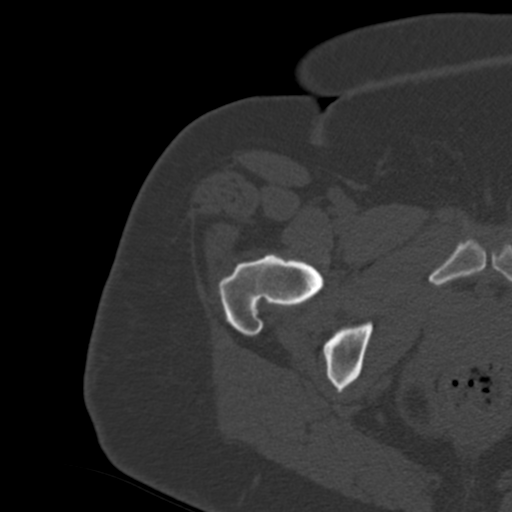
[im 70/104  soft-tissue]
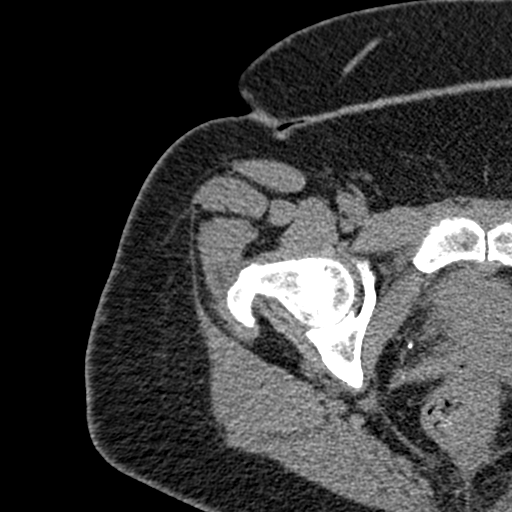
[im 77/104  soft-tissue]
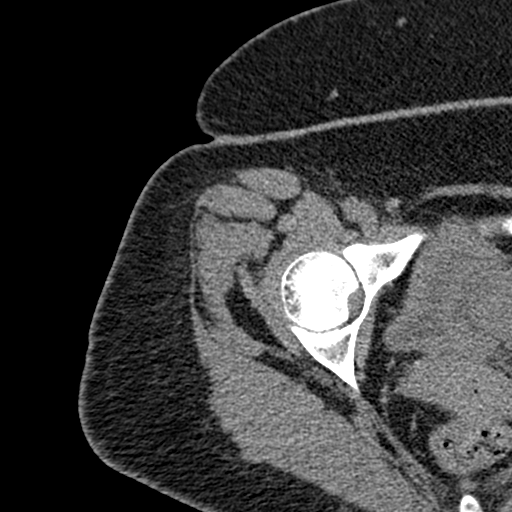
[im 84/104  soft-tissue]
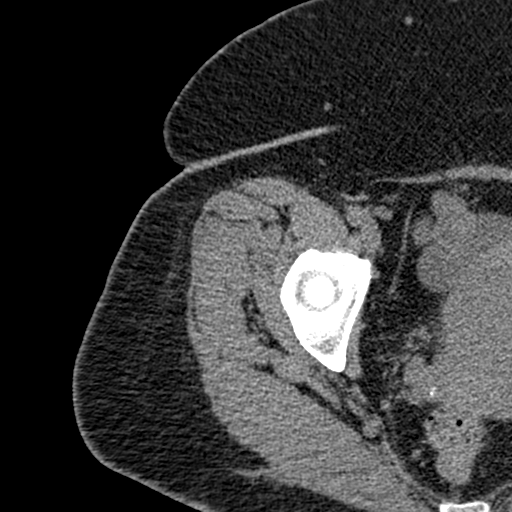
[im 90/104  soft-tissue]
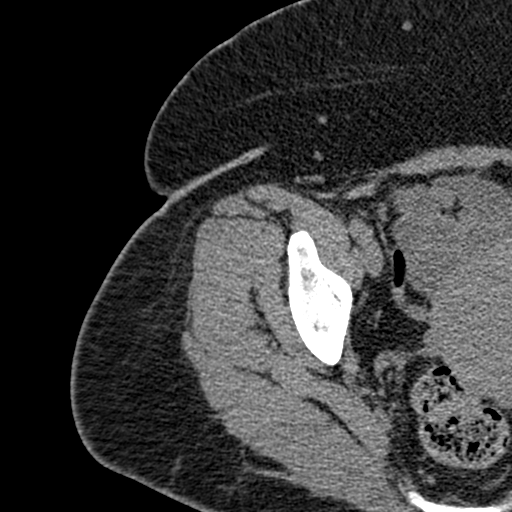
[im 97/104  soft-tissue]
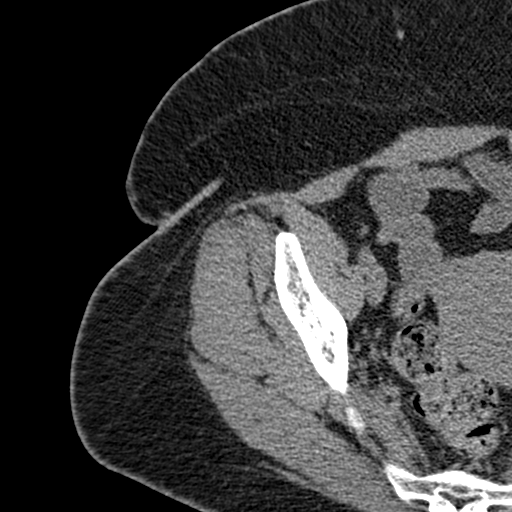

[Series 10: coronal st · coronal · 0.41mm/px · 3 of 130 slices shown]
[im 44/130  soft-tissue]
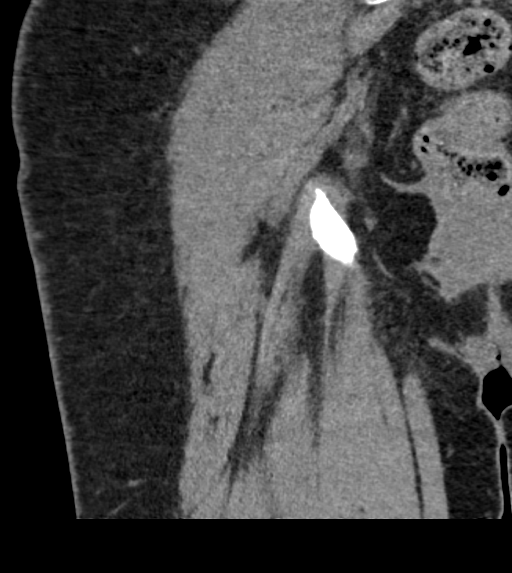
[im 58/130  soft-tissue]
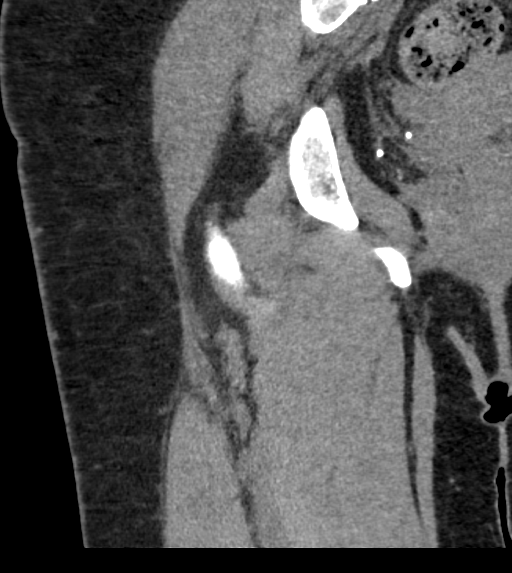
[im 72/130  soft-tissue]
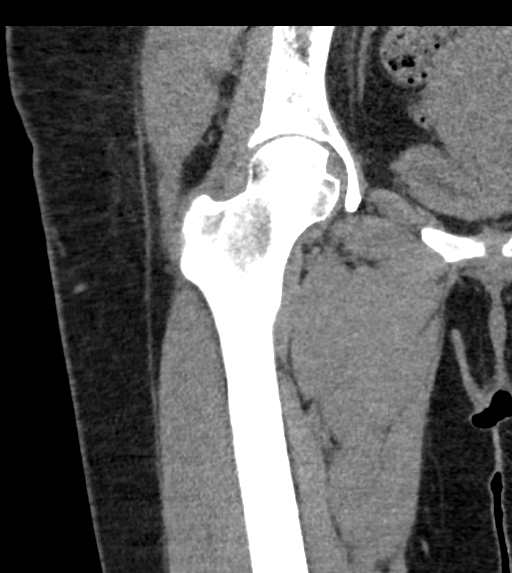

[17 of 46 positions shown; findings below may reference images not displayed]

FINDINGS: Examination is limited to the right hip and inferior right
hemipelvis. The entire pelvis is not imaged.

Bones/Joint/Cartilage

There is no evidence of acute fracture or dislocation. The right
femoral head appears normal without evidence of avascular necrosis.
No significant arthropathy or hip joint effusion. The symphysis
pubis appears normal. The sacroiliac joint is not imaged.

Ligaments

Not relevant for exam/indication.

Muscles and Tendons

The right hip muscles and tendons appear unremarkable as imaged by
CT.

Soft tissues

No periarticular fluid collections or inflammatory changes are seen.
The uterus appears somewhat globular, suggesting possible fibroids
or adenomyosis, incompletely visualized.
IMPRESSION: Negative CT of the right hip.

## 2019-06-03 ENCOUNTER — Ambulatory Visit
Admission: EM | Admit: 2019-06-03 | Discharge: 2019-06-03 | Disposition: A | Discharge status: Home / Self Care | Payer: Medicare Other | Attending: Family Medicine | Admitting: Family Medicine

## 2019-06-03 ENCOUNTER — Encounter: Payer: Self-pay | Admitting: Emergency Medicine

## 2019-06-03 ENCOUNTER — Other Ambulatory Visit: Payer: Self-pay

## 2019-06-03 DIAGNOSIS — J069 Acute upper respiratory infection, unspecified: Secondary | ICD-10-CM

## 2019-06-03 DIAGNOSIS — H6692 Otitis media, unspecified, left ear: Secondary | ICD-10-CM

## 2019-06-03 MED ORDER — DOXYCYCLINE HYCLATE 100 MG PO TABS: 100.0000 mg | ORAL_TABLET | Freq: Two times a day (BID) | ORAL | 0 refills | Status: DC

## 2019-06-03 NOTE — Discharge Instructions (Signed)
Rest, fluids, over the counter medications °

## 2019-06-03 NOTE — ED Triage Notes (Signed)
Patient c/o dry cough, headache, left side neck pain that started 1 week ago.

## 2019-06-04 LAB — NOVEL CORONAVIRUS, NAA (HOSP ORDER, SEND-OUT TO REF LAB; TAT 18-24 HRS): SARS-CoV-2, NAA: NOT DETECTED

## 2019-06-07 ENCOUNTER — Telehealth: Payer: Self-pay | Admitting: Emergency Medicine

## 2019-06-07 MED ORDER — FLUCONAZOLE 150 MG PO TABS: 150.0000 mg | ORAL_TABLET | Freq: Every day | ORAL | 0 refills | Status: AC

## 2019-06-07 NOTE — Telephone Encounter (Signed)
Patient called in today c/o vaginal itching since being on antibiotic for ear infection. Spoke with Dr. Zenda Alpers and he advised to call in Diflucan 150mg  taking 1 now and repeat after finishing antibiotics. If symptoms don't resolve will need to be evaluated. Patient agreed and voiced understanding. Prescription sent to [facility] [place].

## 2019-06-07 NOTE — ED Provider Notes (Signed)
[facility]    CSN: [id number] Arrival date & time: 06/03/19  1729      History   Chief Complaint Chief Complaint  Patient presents with  . Cough  . Headache    HPI Courtney [name] is a 63 y.o. female.   63 yo female with a c/o dry cough, headaches, and left sided neck pain and ear pain for the past week. Denies any fevers, chills, chest pains, shortness of breath.    Cough Associated symptoms: headaches   Headache Associated symptoms: cough     Past Medical History:  Diagnosis Date  . Anxiety and depression   . Chronic back pain   . Diabetes mellitus without complication (HCC)   . GERD (gastroesophageal reflux disease)   . Hyperlipidemia   . Hypertension   . Insomnia   . Obesity   . Sleep apnea   . Trochanteric bursitis     Patient Active Problem List   Diagnosis Date Noted  . S/P rotator cuff surgery 06/25/2017  . Bad headache 01/15/2016    Past Surgical History:  Procedure Laterality Date  . BACK SURGERY    . HEMORROIDECTOMY    . LUMBAR DISC SURGERY  1999  . right shoulder surgery      rotator cuff repair  . SHOULDER ARTHROSCOPY WITH OPEN ROTATOR CUFF REPAIR Left 06/25/2017   Procedure: SHOULDER ARTHROSCOPY WITH OPEN ROTATOR CUFF REPAIR,DISTAL CLAVICLE EXCISION, & SUBACROMINAL DECOMPRESSION;  Surgeon: Juanell Fairly, MD;  Location: [facility];  Service: Orthopedics;  Laterality: Left;    OB History   No obstetric history on file.      Home Medications    Prior to Admission medications   Medication Sig Start Date End Date Taking? Authorizing Provider  aspirin EC 81 MG tablet Take 81 mg by mouth every other day.   Yes [provider]  cholecalciferol (VITAMIN D) 1000 units tablet Take 2,000 Units by mouth daily.    Yes [provider]  clonazePAM (KLONOPIN) 1 MG tablet Take 1 mg by mouth 2 (two) times daily as needed for anxiety.    Yes [provider]  cyclobenzaprine (FLEXERIL) 5 MG tablet Take 1-2 tablets  3 times daily as needed 06/30/18  Yes Enid Derry, PA-C  fluticasone [facility]) 50 MCG/ACT nasal spray Place 2 sprays into both nostrils daily.   Yes [provider]  glipiZIDE-metformin (METAGLIP) 5-500 MG tablet Take 2 tablets by mouth 2 (two) times daily before a meal.   Yes [provider]  loratadine (CLARITIN) 10 MG tablet Take 10 mg by mouth daily.   Yes [provider]  meclizine (ANTIVERT) 25 MG tablet Take 1 tablet (25 mg total) by mouth 3 (three) times daily as needed for dizziness or nausea. 02/15/18  Yes Myrna Blazer, MD  olmesartan-hydrochlorothiazide (BENICAR HCT) 40-12.5 MG tablet Take 1 tablet by mouth daily.   Yes [provider]  omeprazole (PRILOSEC) 20 MG capsule Take 20 mg by mouth daily.   Yes [provider]  sitaGLIPtin (JANUVIA) 100 MG tablet Take 100 mg by mouth daily.   Yes [provider]  zolpidem (AMBIEN) 10 MG tablet Take 10 mg by mouth at bedtime.   Yes [provider]  doxycycline (VIBRA-TABS) 100 MG tablet Take 1 tablet (100 mg total) by mouth 2 (two) times daily. 06/03/19   Payton Mccallum, MD    Family History Family History  Problem Relation Age of Onset  . Diabetes Father     Social History  Social History   Tobacco Use  . Smoking status: Never Smoker  . Smokeless tobacco: Never Used  Substance Use Topics  . Alcohol use: No  . Drug use: No     Allergies   Tizanidine, Ace inhibitors, Codeine, Lidoderm [lidocaine], Naproxen, Niacin and related, Penicillins, Pravastatin, Prednisone, Simvastatin, and Tramadol   Review of Systems Review of Systems  Respiratory: Positive for cough.   Neurological: Positive for headaches.     Physical Exam Triage Vital Signs ED Triage Vitals  Enc Vitals Group     BP 06/03/19 1753 (!) 160/71     Pulse Rate 06/03/19 1753 79     Resp 06/03/19 1753 18     Temp 06/03/19 1753 98.5 F (36.9 C)     Temp Source 06/03/19 1753 Oral     SpO2  06/03/19 1753 96 %     Weight 06/03/19 1751 164 lb (74.4 kg)     Height 06/03/19 1751 5' (1.524 m)     Head Circumference --      Peak Flow --      Pain Score 06/03/19 1750 8     Pain Loc --      Pain Edu? --      Excl. in GC? --    No data found.  Updated Vital Signs BP (!) 160/71 (BP Location: Left Arm)   Pulse 79   Temp 98.5 F (36.9 C) (Oral)   Resp 18   Ht 5' (1.524 m)   Wt 74.4 kg   SpO2 96%   BMI 32.03 kg/m   Visual Acuity Right Eye Distance:   Left Eye Distance:   Bilateral Distance:    Right Eye Near:   Left Eye Near:    Bilateral Near:     Physical Exam Vitals and nursing note reviewed.  Constitutional:      General: She is not in acute distress.    Appearance: She is not diaphoretic.  HENT:     Left Ear: Tympanic membrane is injected and bulging.  Eyes:     Extraocular Movements: Extraocular movements intact.     Pupils: Pupils are equal, round, and reactive to light.  Cardiovascular:     Rate and Rhythm: Normal rate.     Heart sounds: Normal heart sounds.  Pulmonary:     Effort: Pulmonary effort is normal. No respiratory distress.  Neurological:     General: No focal deficit present.     Mental Status: She is alert.      UC Treatments / Results  Labs (all labs ordered are listed, but only abnormal results are displayed) Labs Reviewed  NOVEL CORONAVIRUS, NAA (HOSP ORDER, SEND-OUT TO REF LAB; TAT 18-24 HRS)    EKG   Radiology No results found.  Procedures Procedures (including critical care time)  Medications Ordered in UC Medications - No data to display  Initial Impression / Assessment and Plan / UC Course  I have reviewed the triage vital signs and the nursing notes.  Pertinent labs & imaging results that were available during my care of the patient were reviewed by me and considered in my medical decision making (see chart for details).      Final Clinical Impressions(s) / UC Diagnoses   Final diagnoses:  Viral URI  with cough  Acute left otitis media     Discharge Instructions     Rest, fluids, over the counter medications    ED Prescriptions    Medication Sig Dispense Auth. Provider  doxycycline (VIBRA-TABS) 100 MG tablet Take 1 tablet (100 mg total) by mouth 2 (two) times daily. 20 tablet Norval Gable, MD     1. diagnosis reviewed with patient 2. rx as per orders above; reviewed possible side effects, interactions, risks and benefits  3. Recommend supportive treatment as above 4. Follow-up prn if symptoms worsen or don't improve   PDMP not reviewed this encounter.   Norval Gable, MD 06/07/19 [phone]

## 2019-11-22 ENCOUNTER — Other Ambulatory Visit: Payer: Self-pay

## 2019-11-22 ENCOUNTER — Ambulatory Visit
Admission: EM | Admit: 2019-11-22 | Discharge: 2019-11-22 | Disposition: A | Discharge status: Home / Self Care | Payer: Medicare Other | Attending: Family Medicine | Admitting: Family Medicine

## 2019-11-22 DIAGNOSIS — B9689 Other specified bacterial agents as the cause of diseases classified elsewhere: Secondary | ICD-10-CM

## 2019-11-22 DIAGNOSIS — N760 Acute vaginitis: Secondary | ICD-10-CM

## 2019-11-22 LAB — WET PREP, GENITAL
Sperm: NONE SEEN
Trich, Wet Prep: NONE SEEN
Yeast Wet Prep HPF POC: NONE SEEN

## 2019-11-22 MED ORDER — METRONIDAZOLE 500 MG PO TABS: 500.0000 mg | ORAL_TABLET | Freq: Two times a day (BID) | ORAL | 0 refills | Status: AC

## 2019-11-22 NOTE — Discharge Instructions (Addendum)
Take medication as prescribed. Rest. Drink plenty of fluids.  ° °Follow up with your primary care physician this week as needed. Return to Urgent care for new or worsening concerns.  ° °

## 2019-11-22 NOTE — ED Triage Notes (Signed)
Patient complains of vaginal itching without any increase in discharge. States that this has been waking her from her sleep. Reports that she has been noticing this off and on since Monday.

## 2019-11-22 NOTE — ED Provider Notes (Signed)
[facility] ____________________________________________  Time seen: Approximately 5:17 PM  I have reviewed the triage vital signs and the nursing notes.   HISTORY  Chief Complaint Vaginal Itching   HPI Courtney [name] is a 64 y.o. female presenting for evaluation of vaginal itching for the last week.  Reports gradual onset.  States feels like previous yeast infections.  Tried over-the-counter Monistat without resolution.  Denies rash, odor, discharge.  Denies concern of STDs.  Denies dysuria, abdominal pain, back pain.  Denies known trigger.  Denies changes in contacts.  Denies fevers or recent sickness.  Reports otherwise doing well.  Beckie Busing, MD : PCP   Past Medical History:  Diagnosis Date   Anxiety and depression    Chronic back pain    Diabetes mellitus without complication (HCC)    GERD (gastroesophageal reflux disease)    Hyperlipidemia    Hypertension    Insomnia    Obesity    Sleep apnea    Trochanteric bursitis     Patient Active Problem List   Diagnosis Date Noted   S/P rotator cuff surgery 06/25/2017   Bad headache 01/15/2016    Past Surgical History:  Procedure Laterality Date   BACK SURGERY     HEMORROIDECTOMY     LUMBAR DISC SURGERY  1999   right shoulder surgery      rotator cuff repair   SHOULDER ARTHROSCOPY WITH OPEN ROTATOR CUFF REPAIR Left 06/25/2017   Procedure: SHOULDER ARTHROSCOPY WITH OPEN ROTATOR CUFF REPAIR,DISTAL CLAVICLE EXCISION, & SUBACROMINAL DECOMPRESSION;  Surgeon: Juanell Fairly, MD;  Location: [facility];  Service: Orthopedics;  Laterality: Left;     No current facility-administered medications for this encounter.  Current Outpatient Medications:    aspirin EC 81 MG tablet, Take 81 mg by mouth every other day., Disp: , Rfl:    clonazePAM (KLONOPIN) 1 MG tablet, Take 1 mg by mouth 2 (two) times daily as needed for anxiety. , Disp: , Rfl:    cyclobenzaprine (FLEXERIL) 5 MG  tablet, Take 1-2 tablets 3 times daily as needed, Disp: 20 tablet, Rfl: 0   fluticasone (FLONASE) 50 MCG/ACT nasal spray, Place 2 sprays into both nostrils daily., Disp: , Rfl:    glipiZIDE-metformin (METAGLIP) 5-500 MG tablet, Take 2 tablets by mouth 2 (two) times daily before a meal., Disp: , Rfl:    HYDROcodone-acetaminophen (NORCO/VICODIN) 5-325 MG tablet, Take 1 tablet by mouth 3 (three) times daily as needed., Disp: , Rfl:    loratadine (CLARITIN) 10 MG tablet, Take 10 mg by mouth daily., Disp: , Rfl:    olmesartan-hydrochlorothiazide (BENICAR HCT) 40-12.5 MG tablet, Take 1 tablet by mouth daily., Disp: , Rfl:    omeprazole (PRILOSEC) 20 MG capsule, Take 20 mg by mouth daily., Disp: , Rfl:    sitaGLIPtin (JANUVIA) 100 MG tablet, Take 100 mg by mouth daily., Disp: , Rfl:    zolpidem (AMBIEN) 10 MG tablet, Take 10 mg by mouth at bedtime., Disp: , Rfl:    cholecalciferol (VITAMIN D) 1000 units tablet, Take 2,000 Units by mouth daily. , Disp: , Rfl:    doxycycline (VIBRA-TABS) 100 MG tablet, Take 1 tablet (100 mg total) by mouth 2 (two) times daily., Disp: 20 tablet, Rfl: 0   meclizine (ANTIVERT) 25 MG tablet, Take 1 tablet (25 mg total) by mouth 3 (three) times daily as needed for dizziness or nausea., Disp: 30 tablet, Rfl: 0   metroNIDAZOLE (FLAGYL) 500 MG tablet, Take 1 tablet (500 mg total) by mouth 2 (two)  times daily., Disp: 14 tablet, Rfl: 0  Allergies Tizanidine, Ace inhibitors, Codeine, Lidoderm [lidocaine], Naproxen, Niacin and related, Penicillins, Pravastatin, Prednisone, Simvastatin, and Tramadol  Family History  Problem Relation Age of Onset   Diabetes Father     Social History Social History   Tobacco Use   Smoking status: Never Smoker   Smokeless tobacco: Never Used  Substance Use Topics   Alcohol use: No   Drug use: No    Review of Systems Constitutional: No fever Cardiovascular: Denies chest pain. Respiratory: Denies shortness of  breath. Gastrointestinal: No abdominal pain.  No nausea, no vomiting.  No diarrhea.  Genitourinary: Negative for dysuria. As above.  Musculoskeletal: Negative for back pain. Skin: Negative for rash.   ____________________________________________   PHYSICAL EXAM:  VITAL SIGNS: ED Triage Vitals  Enc Vitals Group     BP 11/22/19 1541 (!) 157/97     Pulse Rate 11/22/19 1541 73     Resp 11/22/19 1541 18     Temp 11/22/19 1541 98.2 F (36.8 C)     Temp Source 11/22/19 1541 Oral     SpO2 11/22/19 1541 97 %     Weight 11/22/19 1538 172 lb (78 kg)     Height 11/22/19 1538 5' (1.524 m)     Head Circumference --      Peak Flow --      Pain Score 11/22/19 1538 8     Pain Loc --      Pain Edu? --      Excl. in Paint? --     Constitutional: Alert and oriented. Well appearing and in no acute distress. Eyes: Conjunctivae are normal.  ENT      Head: Normocephalic and atraumatic. Respiratory: Normal respiratory effort without tachypnea nor retractions. Gastrointestinal: Soft and nontender.  No CVA tenderness. Musculoskeletal:  Steady gait. Neurologic:  Normal speech and language. Speech is normal. No gait instability.  Skin:  Skin is warm, dry and intact. No rash noted. Psychiatric: Mood and affect are normal. Speech and behavior are normal. Patient exhibits appropriate insight and judgment   ___________________________________________   LABS (all labs ordered are listed, but only abnormal results are displayed)  Labs Reviewed  WET PREP, GENITAL - Abnormal; Notable for the following components:      Result Value   Clue Cells Wet Prep HPF POC PRESENT (*)    WBC, Wet Prep HPF POC FEW (*)    All other components within normal limits     PROCEDURES Procedures     INITIAL IMPRESSION / ASSESSMENT AND PLAN / ED COURSE  Pertinent labs & imaging results that were available during my care of the patient were reviewed by me and considered in my medical decision making (see chart for  details).  11 patient.  No acute distress.  Vaginal itching.  Patient elected for self wet prep.  Wet prep positive for bacterial vaginosis.  Discussed treatment options with patient, will treat with oral Flagyl.  Encourage rest and  Supportive care monitoring. Discussed indication, risks and benefits of medications with patient.   Discussed follow up with Primary care physician this week as needed. Discussed follow up and return parameters including no resolution or any worsening concerns. Patient verbalized understanding and agreed to plan.   ____________________________________________   FINAL CLINICAL IMPRESSION(S) / ED DIAGNOSES  Final diagnoses:  Bacterial vaginosis     ED Discharge Orders         Ordered    metroNIDAZOLE (FLAGYL) 500 MG tablet  2 times daily     11/22/19 1636           Note: This dictation was prepared with Dragon dictation along with smaller phrase technology. Any transcriptional errors that result from this process are unintentional.         Renford Dills, NP 11/22/19 1720

## 2019-11-25 ENCOUNTER — Other Ambulatory Visit: Payer: Self-pay

## 2019-11-25 ENCOUNTER — Ambulatory Visit
Admission: EM | Admit: 2019-11-25 | Discharge: 2019-11-25 | Disposition: A | Discharge status: Home / Self Care | Payer: Medicare Other | Attending: Family Medicine | Admitting: Family Medicine

## 2019-11-25 DIAGNOSIS — Z20822 Contact with and (suspected) exposure to covid-19: Secondary | ICD-10-CM | POA: Diagnosis present

## 2019-11-25 NOTE — ED Triage Notes (Signed)
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

## 2019-11-25 NOTE — ED Provider Notes (Signed)
[facility]    CSN: [id number] Arrival date & time: 11/25/19  1526  History   Chief Complaint Chief Complaint  Patient presents with   Covid Exposure   HPI   64 year old female presents with the above complaint.  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  She was encouraged to come in to get testing.  She is currently asymptomatic and feeling well.  Desires testing today.  Past Medical History:  Diagnosis Date   Anxiety and depression    Chronic back pain    Diabetes mellitus without complication (HCC)    GERD (gastroesophageal reflux disease)    Hyperlipidemia    Hypertension    Insomnia    Obesity    Sleep apnea    Trochanteric bursitis     Patient Active Problem List   Diagnosis Date Noted   S/P rotator cuff surgery 06/25/2017   Bad headache 01/15/2016    Past Surgical History:  Procedure Laterality Date   BACK SURGERY     HEMORROIDECTOMY     LUMBAR DISC SURGERY  1999   right shoulder surgery      rotator cuff repair   SHOULDER ARTHROSCOPY WITH OPEN ROTATOR CUFF REPAIR Left 06/25/2017   Procedure: SHOULDER ARTHROSCOPY WITH OPEN ROTATOR CUFF REPAIR,DISTAL CLAVICLE EXCISION, & SUBACROMINAL DECOMPRESSION;  Surgeon: Juanell Fairly, MD;  Location: [facility];  Service: Orthopedics;  Laterality: Left;    OB History   No obstetric history on file.      Home Medications    Prior to Admission medications   Medication Sig Start Date End Date Taking? Authorizing Provider  aspirin EC 81 MG tablet Take 81 mg by mouth every other day.   Yes [provider]  cholecalciferol (VITAMIN D) 1000 units tablet Take 2,000 Units by mouth daily.    Yes [provider]  clonazePAM (KLONOPIN) 1 MG tablet Take 1 mg by mouth 2 (two) times daily as needed for anxiety.    Yes [provider]  cyclobenzaprine (FLEXERIL) 5 MG tablet Take 1-2 tablets 3 times daily as needed 06/30/18   Yes Enid Derry, PA-C  fluticasone [facility]) 50 MCG/ACT nasal spray Place 2 sprays into both nostrils daily.   Yes [provider]  glipiZIDE-metformin (METAGLIP) 5-500 MG tablet Take 2 tablets by mouth 2 (two) times daily before a meal.   Yes [provider]  HYDROcodone-acetaminophen (NORCO/VICODIN) 5-325 MG tablet Take 1 tablet by mouth 3 (three) times daily as needed. 11/22/19  Yes [provider]  loratadine (CLARITIN) 10 MG tablet Take 10 mg by mouth daily.   Yes [provider]  meclizine (ANTIVERT) 25 MG tablet Take 1 tablet (25 mg total) by mouth 3 (three) times daily as needed for dizziness or nausea. 02/15/18  Yes Schaevitz, Myra Rude, MD  metroNIDAZOLE (FLAGYL) 500 MG tablet Take 1 tablet (500 mg total) by mouth 2 (two) times daily. 11/22/19  Yes Renford Dills, NP  olmesartan-hydrochlorothiazide (BENICAR HCT) 40-12.5 MG tablet Take 1 tablet by mouth daily.   Yes [provider]  omeprazole (PRILOSEC) 20 MG capsule Take 20 mg by mouth daily.   Yes [provider]  sitaGLIPtin (JANUVIA) 100 MG tablet Take 100 mg by mouth daily.   Yes [provider]  zolpidem (AMBIEN) 10 MG tablet Take 10 mg by mouth at bedtime.   Yes [provider]    Family History Family History  Problem Relation Age of Onset  Diabetes Father     Social History Social History   Tobacco Use   Smoking status: Never Smoker   Smokeless tobacco: Never Used  Substance Use Topics   Alcohol use: No   Drug use: No     Allergies   Tizanidine, Ace inhibitors, Codeine, Lidoderm [lidocaine], Naproxen, Niacin and related, Penicillins, Pravastatin, Prednisone, Simvastatin, and Tramadol   Review of Systems Review of Systems  Constitutional: Negative.   HENT: Negative.    Physical Exam Triage Vital Signs ED Triage Vitals  Enc Vitals Group     BP 11/25/19 1543 (!) 141/79     Pulse Rate 11/25/19 1543 76     Resp 11/25/19 1543  18     Temp 11/25/19 1543 98.1 F (36.7 C)     Temp Source 11/25/19 1543 Oral     SpO2 11/25/19 1543 98 %     Weight 11/25/19 1541 171 lb 15.3 oz (78 kg)     Height 11/25/19 1541 5' (1.524 m)     Head Circumference --      Peak Flow --      Pain Score 11/25/19 1540 0     Pain Loc --      Pain Edu? --      Excl. in GC? --    Updated Vital Signs BP (!) 141/79 (BP Location: Left Arm)    Pulse 76    Temp 98.1 F (36.7 C) (Oral)    Resp 18    Ht 5' (1.524 m)    Wt 78 kg    SpO2 98%    BMI 33.58 kg/m   Visual Acuity Right Eye Distance:   Left Eye Distance:   Bilateral Distance:    Right Eye Near:   Left Eye Near:    Bilateral Near:     Physical Exam Vitals reviewed.  Constitutional:      General: She is not in acute distress.    Appearance: Normal appearance. She is not ill-appearing.  HENT:     Head: Normocephalic and atraumatic.  Eyes:     General:        Right eye: No discharge.        Left eye: No discharge.     Conjunctiva/sclera: Conjunctivae normal.  Cardiovascular:     Rate and Rhythm: Normal rate and regular rhythm.     Heart sounds: No murmur.  Pulmonary:     Effort: Pulmonary effort is normal.     Breath sounds: Normal breath sounds. No wheezing, rhonchi or rales.  Neurological:     Mental Status: She is alert.  Psychiatric:        Mood and Affect: Mood normal.        Behavior: Behavior normal.    UC Treatments / Results  Labs (all labs ordered are listed, but only abnormal results are displayed) Labs Reviewed  SARS CORONAVIRUS 2 (TAT 6-24 HRS)    EKG   Radiology No results found.  Procedures Procedures (including critical care time)  Medications Ordered in UC Medications - No data to display  Initial Impression / Assessment and Plan / UC Course  I have reviewed the triage vital signs and the nursing notes.  Pertinent labs & imaging results that were available during my care of the patient were reviewed by me and considered in my medical  decision making (see chart for details).    64 year old female presents with exposure to COVID-19.  She is currently asymptomatic.  Awaiting Covid test results.  Final Clinical Impressions(s) / UC Diagnoses   Final diagnoses:  Exposure to COVID-19 virus     Discharge Instructions     Results available in 24 to 48 hours.  Stay home.  Take care  Dr. Lacinda Axon     ED Prescriptions    None     PDMP not reviewed this encounter.   Coral Spikes, [place] 11/25/19 1635

## 2019-11-25 NOTE — Discharge Instructions (Signed)
Results available in 24 to 48 hours.  Stay home.  Take care  Dr. Bearett Porcaro   

## 2019-11-26 ENCOUNTER — Telehealth: Payer: Self-pay | Admitting: Emergency Medicine

## 2019-11-26 LAB — SARS CORONAVIRUS 2 (TAT 6-24 HRS): SARS Coronavirus 2: NEGATIVE

## 2019-11-26 MED ORDER — FLUCONAZOLE 150 MG PO TABS: ORAL_TABLET | ORAL | 0 refills | Status: AC

## 2019-11-26 NOTE — Telephone Encounter (Signed)
Patient called in stating that she was still having vaginal itching that has moved up on her vagina. Patient was seen 11/22/19 and treated for BV. Patient states she is taking as directed. I spoke with Renford Dills, NP who treated patient on 11/22/19 and she advised that we could send in Diflucan 150mg  take 1 tablet now and repeat in 3 days. If no improvement, patient would need to be re-evaluated. Patient agree and voiced understanding. RX called to [facility].

## 2021-01-08 ENCOUNTER — Other Ambulatory Visit: Payer: Self-pay | Admitting: Pain Medicine

## 2021-01-08 DIAGNOSIS — G894 Chronic pain syndrome: Secondary | ICD-10-CM

## 2022-09-10 ENCOUNTER — Other Ambulatory Visit: Payer: 59
